# Patient Record
Sex: Female | Born: 1985 | Race: White | Hispanic: No | Marital: Single | Smoking: Never smoker
Health system: Southern US, Community
[De-identification: ages and names within clinical notes are randomized; demographics above are authoritative.]

## PROBLEM LIST (undated history)

## (undated) DIAGNOSIS — F32A Depression, unspecified: Secondary | ICD-10-CM

## (undated) DIAGNOSIS — F419 Anxiety disorder, unspecified: Secondary | ICD-10-CM

## (undated) DIAGNOSIS — M1990 Unspecified osteoarthritis, unspecified site: Secondary | ICD-10-CM

## (undated) DIAGNOSIS — F1910 Other psychoactive substance abuse, uncomplicated: Secondary | ICD-10-CM

## (undated) DIAGNOSIS — I10 Essential (primary) hypertension: Secondary | ICD-10-CM

## (undated) DIAGNOSIS — F329 Major depressive disorder, single episode, unspecified: Secondary | ICD-10-CM

## (undated) HISTORY — PX: TONSILLECTOMY: SUR1361

---

## 2009-12-02 ENCOUNTER — Encounter: Admission: RE | Admit: 2009-12-02 | Discharge: 2009-12-02 | Payer: Self-pay | Admitting: Internal Medicine

## 2010-02-09 ENCOUNTER — Ambulatory Visit
Admission: RE | Admit: 2010-02-09 | Discharge: 2010-02-09 | Payer: Self-pay | Source: Home / Self Care | Admitting: Otolaryngology

## 2010-05-25 LAB — BASIC METABOLIC PANEL
BUN: 6 mg/dL (ref 6–23)
CO2: 27 mEq/L (ref 19–32)
Chloride: 105 mEq/L (ref 96–112)
Creatinine, Ser: 0.73 mg/dL (ref 0.4–1.2)
Potassium: 4.2 mEq/L (ref 3.5–5.1)

## 2010-05-25 LAB — POCT HEMOGLOBIN-HEMACUE: Hemoglobin: 12.6 g/dL (ref 12.0–15.0)

## 2016-11-04 ENCOUNTER — Emergency Department (HOSPITAL_COMMUNITY): Payer: Self-pay

## 2016-11-04 ENCOUNTER — Inpatient Hospital Stay (HOSPITAL_COMMUNITY): Payer: Self-pay

## 2016-11-04 ENCOUNTER — Inpatient Hospital Stay (HOSPITAL_COMMUNITY)
Admission: EM | Admit: 2016-11-04 | Discharge: 2016-11-09 | DRG: 917 | Disposition: A | Discharge status: Other Acute Inpatient Hospital | Payer: Self-pay | Attending: Internal Medicine | Admitting: Internal Medicine

## 2016-11-04 ENCOUNTER — Encounter (HOSPITAL_COMMUNITY): Payer: Self-pay | Admitting: Emergency Medicine

## 2016-11-04 DIAGNOSIS — Z9289 Personal history of other medical treatment: Secondary | ICD-10-CM

## 2016-11-04 DIAGNOSIS — R001 Bradycardia, unspecified: Secondary | ICD-10-CM | POA: Diagnosis present

## 2016-11-04 DIAGNOSIS — G9341 Metabolic encephalopathy: Secondary | ICD-10-CM | POA: Diagnosis present

## 2016-11-04 DIAGNOSIS — R45851 Suicidal ideations: Secondary | ICD-10-CM

## 2016-11-04 DIAGNOSIS — F329 Major depressive disorder, single episode, unspecified: Secondary | ICD-10-CM | POA: Diagnosis present

## 2016-11-04 DIAGNOSIS — F332 Major depressive disorder, recurrent severe without psychotic features: Secondary | ICD-10-CM | POA: Diagnosis present

## 2016-11-04 DIAGNOSIS — F419 Anxiety disorder, unspecified: Secondary | ICD-10-CM | POA: Diagnosis present

## 2016-11-04 DIAGNOSIS — T50902A Poisoning by unspecified drugs, medicaments and biological substances, intentional self-harm, initial encounter: Principal | ICD-10-CM | POA: Diagnosis present

## 2016-11-04 DIAGNOSIS — Z9911 Dependence on respirator [ventilator] status: Secondary | ICD-10-CM

## 2016-11-04 DIAGNOSIS — R9431 Abnormal electrocardiogram [ECG] [EKG]: Secondary | ICD-10-CM | POA: Diagnosis present

## 2016-11-04 DIAGNOSIS — R402432 Glasgow coma scale score 3-8, at arrival to emergency department: Secondary | ICD-10-CM

## 2016-11-04 DIAGNOSIS — Z9114 Patient's other noncompliance with medication regimen: Secondary | ICD-10-CM

## 2016-11-04 DIAGNOSIS — Z915 Personal history of self-harm: Secondary | ICD-10-CM

## 2016-11-04 DIAGNOSIS — J9601 Acute respiratory failure with hypoxia: Secondary | ICD-10-CM | POA: Diagnosis present

## 2016-11-04 DIAGNOSIS — E875 Hyperkalemia: Secondary | ICD-10-CM | POA: Diagnosis present

## 2016-11-04 DIAGNOSIS — G9340 Encephalopathy, unspecified: Secondary | ICD-10-CM

## 2016-11-04 DIAGNOSIS — I959 Hypotension, unspecified: Secondary | ICD-10-CM

## 2016-11-04 DIAGNOSIS — I10 Essential (primary) hypertension: Secondary | ICD-10-CM | POA: Diagnosis present

## 2016-11-04 DIAGNOSIS — T50901A Poisoning by unspecified drugs, medicaments and biological substances, accidental (unintentional), initial encounter: Secondary | ICD-10-CM | POA: Diagnosis present

## 2016-11-04 DIAGNOSIS — T17908A Unspecified foreign body in respiratory tract, part unspecified causing other injury, initial encounter: Secondary | ICD-10-CM | POA: Diagnosis present

## 2016-11-04 DIAGNOSIS — M1990 Unspecified osteoarthritis, unspecified site: Secondary | ICD-10-CM | POA: Diagnosis present

## 2016-11-04 DIAGNOSIS — J690 Pneumonitis due to inhalation of food and vomit: Secondary | ICD-10-CM | POA: Diagnosis present

## 2016-11-04 DIAGNOSIS — J9383 Other pneumothorax: Secondary | ICD-10-CM | POA: Diagnosis present

## 2016-11-04 DIAGNOSIS — I4581 Long QT syndrome: Secondary | ICD-10-CM | POA: Diagnosis present

## 2016-11-04 DIAGNOSIS — E876 Hypokalemia: Secondary | ICD-10-CM | POA: Clinically undetermined

## 2016-11-04 DIAGNOSIS — J939 Pneumothorax, unspecified: Secondary | ICD-10-CM | POA: Clinically undetermined

## 2016-11-04 HISTORY — DX: Unspecified osteoarthritis, unspecified site: M19.90

## 2016-11-04 HISTORY — DX: Anxiety disorder, unspecified: F41.9

## 2016-11-04 HISTORY — DX: Depression, unspecified: F32.A

## 2016-11-04 HISTORY — DX: Essential (primary) hypertension: I10

## 2016-11-04 HISTORY — DX: Major depressive disorder, single episode, unspecified: F32.9

## 2016-11-04 HISTORY — DX: Other psychoactive substance abuse, uncomplicated: F19.10

## 2016-11-04 LAB — I-STAT ARTERIAL BLOOD GAS, ED
Bicarbonate: 26.6 mmol/L (ref 20.0–28.0)
O2 SAT: 100 %
PCO2 ART: 50.7 mmHg — AB (ref 32.0–48.0)
TCO2: 28 mmol/L (ref 22–32)
pH, Arterial: 7.324 — ABNORMAL LOW (ref 7.350–7.450)
pO2, Arterial: 501 mmHg — ABNORMAL HIGH (ref 83.0–108.0)

## 2016-11-04 LAB — BASIC METABOLIC PANEL
Anion gap: 10 (ref 5–15)
BUN: 8 mg/dL (ref 6–20)
CALCIUM: 9.5 mg/dL (ref 8.9–10.3)
CHLORIDE: 108 mmol/L (ref 101–111)
CO2: 25 mmol/L (ref 22–32)
Creatinine, Ser: 0.81 mg/dL (ref 0.44–1.00)
GFR calc Af Amer: >60 mL/min (ref >60)
Glucose, Bld: 109 mg/dL — ABNORMAL HIGH (ref 65–99)
Potassium: 4.2 mmol/L (ref 3.5–5.1)
SODIUM: 143 mmol/L (ref 135–145)

## 2016-11-04 LAB — CBC
HCT: 40.4 % (ref 36.0–46.0)
Hemoglobin: 13.8 g/dL (ref 12.0–15.0)
MCH: 30.3 pg (ref 26.0–34.0)
MCHC: 34.2 g/dL (ref 30.0–36.0)
MCV: 88.8 fL (ref 78.0–100.0)
PLATELETS: 358 10*3/uL (ref 150–400)
RBC: 4.55 MIL/uL (ref 3.87–5.11)
RDW: 13.2 % (ref 11.5–15.5)
WBC: 13.5 10*3/uL — AB (ref 4.0–10.5)

## 2016-11-04 LAB — GLUCOSE, CAPILLARY
Glucose-Capillary: 120 mg/dL — ABNORMAL HIGH (ref 65–99)
Glucose-Capillary: 120 mg/dL — ABNORMAL HIGH (ref 65–99)

## 2016-11-04 LAB — I-STAT CHEM 8, ED
BUN: 12 mg/dL (ref 6–20)
Calcium, Ion: 1.08 mmol/L — ABNORMAL LOW (ref 1.15–1.40)
Chloride: 110 mmol/L (ref 101–111)
Creatinine, Ser: 0.7 mg/dL (ref 0.44–1.00)
Glucose, Bld: 155 mg/dL — ABNORMAL HIGH (ref 65–99)
HEMATOCRIT: 42 % (ref 36.0–46.0)
HEMOGLOBIN: 14.3 g/dL (ref 12.0–15.0)
POTASSIUM: 6.4 mmol/L — AB (ref 3.5–5.1)
Sodium: 140 mmol/L (ref 135–145)
TCO2: 26 mmol/L (ref 22–32)

## 2016-11-04 LAB — SALICYLATE LEVEL

## 2016-11-04 LAB — LACTIC ACID, PLASMA
LACTIC ACID, VENOUS: 2.9 mmol/L — AB (ref 0.5–1.9)
LACTIC ACID, VENOUS: 3.5 mmol/L — AB (ref 0.5–1.9)

## 2016-11-04 LAB — URINALYSIS, ROUTINE W REFLEX MICROSCOPIC
Bilirubin Urine: NEGATIVE
Glucose, UA: NEGATIVE mg/dL
Ketones, ur: 5 mg/dL — AB
Leukocytes, UA: NEGATIVE
Nitrite: NEGATIVE
PH: 5 (ref 5.0–8.0)
Protein, ur: 30 mg/dL — AB
SPECIFIC GRAVITY, URINE: 1.028 (ref 1.005–1.030)

## 2016-11-04 LAB — COMPREHENSIVE METABOLIC PANEL
ALK PHOS: 42 U/L (ref 38–126)
ALT: 28 U/L (ref 14–54)
ANION GAP: 10 (ref 5–15)
AST: 62 U/L — ABNORMAL HIGH (ref 15–41)
Albumin: 3.8 g/dL (ref 3.5–5.0)
BILIRUBIN TOTAL: 1.1 mg/dL (ref 0.3–1.2)
BUN: 9 mg/dL (ref 6–20)
CALCIUM: 8.8 mg/dL — AB (ref 8.9–10.3)
CO2: 22 mmol/L (ref 22–32)
CREATININE: 0.79 mg/dL (ref 0.44–1.00)
Chloride: 109 mmol/L (ref 101–111)
GFR calc non Af Amer: >60 mL/min (ref >60)
GLUCOSE: 123 mg/dL — AB (ref 65–99)
Potassium: 4 mmol/L (ref 3.5–5.1)
Sodium: 141 mmol/L (ref 135–145)
TOTAL PROTEIN: 6.5 g/dL (ref 6.5–8.1)

## 2016-11-04 LAB — TROPONIN I: TROPONIN I: 0.04 ng/mL — AB (ref <0.03)

## 2016-11-04 LAB — RAPID URINE DRUG SCREEN, HOSP PERFORMED
Amphetamines: NOT DETECTED
Barbiturates: NOT DETECTED
Benzodiazepines: NOT DETECTED
Cocaine: NOT DETECTED
OPIATES: NOT DETECTED
Tetrahydrocannabinol: NOT DETECTED

## 2016-11-04 LAB — CK: CK TOTAL: 7143 U/L — AB (ref 38–234)

## 2016-11-04 LAB — ACETAMINOPHEN LEVEL: Acetaminophen (Tylenol), Serum: <10 ug/mL — ABNORMAL LOW (ref 10–30)

## 2016-11-04 LAB — ETHANOL: Alcohol, Ethyl (B): <5 mg/dL (ref <5)

## 2016-11-04 LAB — TRIGLYCERIDES: Triglycerides: 53 mg/dL (ref <150)

## 2016-11-04 LAB — I-STAT BETA HCG BLOOD, ED (MC, WL, AP ONLY)

## 2016-11-04 LAB — MRSA PCR SCREENING: MRSA by PCR: NEGATIVE

## 2016-11-04 LAB — PROCALCITONIN

## 2016-11-04 LAB — LIPASE, BLOOD: Lipase: 22 U/L (ref 11–51)

## 2016-11-04 MED ORDER — SODIUM CHLORIDE 0.9 % IV SOLN
3.0000 g | Freq: Four times a day (QID) | INTRAVENOUS | Status: DC
  Administered 2016-11-04 – 2016-11-07 (×12): 3 g via INTRAVENOUS
  Filled 2016-11-04 (×15): qty 3

## 2016-11-04 MED ORDER — PROPOFOL 1000 MG/100ML IV EMUL
INTRAVENOUS | Status: AC
  Filled 2016-11-04: qty 100

## 2016-11-04 MED ORDER — PROPOFOL 1000 MG/100ML IV EMUL: 5.0000 ug/kg/min | Freq: Once | INTRAVENOUS | Status: DC

## 2016-11-04 MED ORDER — SODIUM CHLORIDE 0.9 % IV SOLN
1.0000 g | Freq: Once | INTRAVENOUS | Status: AC
  Administered 2016-11-04: 1 g via INTRAVENOUS
  Filled 2016-11-04: qty 10

## 2016-11-04 MED ORDER — PROPOFOL 1000 MG/100ML IV EMUL: 0.0000 ug/kg/min | INTRAVENOUS | Status: DC

## 2016-11-04 MED ORDER — PANTOPRAZOLE SODIUM 40 MG IV SOLR
40.0000 mg | Freq: Every day | INTRAVENOUS | Status: DC
  Administered 2016-11-04 – 2016-11-06 (×3): 40 mg via INTRAVENOUS
  Filled 2016-11-04 (×3): qty 40

## 2016-11-04 MED ORDER — FENTANYL CITRATE (PF) 100 MCG/2ML IJ SOLN
INTRAMUSCULAR | Status: AC
  Filled 2016-11-04: qty 2

## 2016-11-04 MED ORDER — FENTANYL CITRATE (PF) 100 MCG/2ML IJ SOLN: 100.0000 ug | INTRAMUSCULAR | Status: DC | PRN

## 2016-11-04 MED ORDER — ETOMIDATE 2 MG/ML IV SOLN
INTRAVENOUS | Status: DC | PRN
  Administered 2016-11-04: 20 mg via INTRAVENOUS

## 2016-11-04 MED ORDER — FENTANYL CITRATE (PF) 100 MCG/2ML IJ SOLN
50.0000 ug | Freq: Once | INTRAMUSCULAR | Status: AC
  Administered 2016-11-04: 50 ug via INTRAVENOUS

## 2016-11-04 MED ORDER — SODIUM CHLORIDE 0.9 % IV SOLN: 250.0000 mL | INTRAVENOUS | Status: DC | PRN

## 2016-11-04 MED ORDER — FENTANYL 2500MCG IN NS 250ML (10MCG/ML) PREMIX INFUSION
25.0000 ug/h | INTRAVENOUS | Status: DC
  Administered 2016-11-04: 50 ug/h via INTRAVENOUS
  Administered 2016-11-05: 100 ug/h via INTRAVENOUS
  Filled 2016-11-04 (×2): qty 250

## 2016-11-04 MED ORDER — SUCCINYLCHOLINE CHLORIDE 20 MG/ML IJ SOLN
INTRAMUSCULAR | Status: DC | PRN
  Administered 2016-11-04: 100 mg via INTRAVENOUS

## 2016-11-04 MED ORDER — CHLORHEXIDINE GLUCONATE 0.12% ORAL RINSE (MEDLINE KIT)
15.0000 mL | Freq: Two times a day (BID) | OROMUCOSAL | Status: DC
  Administered 2016-11-04 – 2016-11-06 (×5): 15 mL via OROMUCOSAL

## 2016-11-04 MED ORDER — ORAL CARE MOUTH RINSE
15.0000 mL | Freq: Four times a day (QID) | OROMUCOSAL | Status: DC
  Administered 2016-11-04 – 2016-11-06 (×9): 15 mL via OROMUCOSAL

## 2016-11-04 MED ORDER — HEPARIN SODIUM (PORCINE) 5000 UNIT/ML IJ SOLN
5000.0000 [IU] | Freq: Three times a day (TID) | INTRAMUSCULAR | Status: DC
  Administered 2016-11-04 – 2016-11-08 (×14): 5000 [IU] via SUBCUTANEOUS
  Filled 2016-11-04 (×15): qty 1

## 2016-11-04 MED ORDER — SODIUM CHLORIDE 0.9 % IV SOLN
INTRAVENOUS | Status: DC
  Administered 2016-11-04 – 2016-11-06 (×7): via INTRAVENOUS

## 2016-11-04 MED ORDER — FENTANYL BOLUS VIA INFUSION
25.0000 ug | INTRAVENOUS | Status: DC | PRN
  Administered 2016-11-04 – 2016-11-06 (×4): 25 ug via INTRAVENOUS
  Filled 2016-11-04: qty 25
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
## 2016-11-04 NOTE — ED Provider Notes (Signed)
Emergency Department Provider Note   I have reviewed the triage vital signs and the nursing notes.   HISTORY  Chief Complaint Drug Overdose   HPI Jennifer [name] is a 31 y.o. female with PMH of HTN and substance abuse presents to the emergency department for evaluation by EMS after being found unresponsive at home. EMS report that the patient has a history of substance abuse. The patient apparently has been under a lot of stress recently with the death of her father. Family found her unresponsive in the bathroom and noticed she had written a suicide note. Mom states that she had recently increased her dose of Prozac. Family is not sure exactly what the patient took. She also had access to Tylenol and Motrin. EMS gave a total of 6 mg of Narcan on scene with minimal improvement in symptoms so transported her to the emergency department actively performing BVM ventilation. The patient is from [place] but family is not aware of other home medications.   Level 5 caveat: patient with GCS 3 and unable to provide history.   Past Medical History:  Diagnosis Date  . Anxiety   . Arthritis   . Depressed   . Hypertension   . Substance abuse     Patient Active Problem List   Diagnosis Date Noted  . Overdose 11/04/2016    History reviewed. No pertinent surgical history.    Allergies Patient has no known allergies.  No family history on file.  Social History Social History  Substance Use Topics  . Smoking status: Not on file  . Smokeless tobacco: Not on file  . Alcohol use Not on file    Review of Systems  Level 5 caveat: Overdose, GCS 3, peri-intubation.   ____________________________________________   PHYSICAL EXAM:  VITAL SIGNS: ED Triage Vitals [11/04/16 1049]  Enc Vitals Group     BP (!) 137/93     Pulse Rate (!) 58     Resp      Temp (!) 97 F (36.1 C)     Temp Source Axillary     SpO2 100 %   Constitutional: Obtunded.  Eyes: Pupils 1 mm and sluggish.    Head: Atraumatic. Nose: No congestion/rhinnorhea. Mouth/Throat: Mucous membranes are moist.  Neck: No stridor.   Cardiovascular: Bradycardia. Good peripheral circulation. Grossly normal heart sounds.   Respiratory: BVM on arrival with equal breath sounds.  Gastrointestinal: Soft. No distention.  Musculoskeletal: No lower extremity tenderness nor edema. No gross deformities of extremities. Neurologic: GCS 3.  Skin:  Skin is warm, dry and intact. No rash noted. Gray discoloration over chest and upper arms that rubs off with alcohol wipe. No extremity cyanosis.   ____________________________________________   LABS (all labs ordered are listed, but only abnormal results are displayed)  Labs Reviewed  CBC - Abnormal; Notable for the following:       Result Value   WBC 13.5 (*)    All other components within normal limits  COMPREHENSIVE METABOLIC PANEL - Abnormal; Notable for the following:    Glucose, Bld 123 (*)    Calcium 8.8 (*)    AST 62 (*)    All other components within normal limits  ACETAMINOPHEN LEVEL - Abnormal; Notable for the following:    Acetaminophen (Tylenol), Serum <10 (*)    All other components within normal limits  I-STAT CHEM 8, ED - Abnormal; Notable for the following:    Potassium 6.4 (*)    Glucose, Bld 155 (*)  Calcium, Ion 1.08 (*)    All other components within normal limits  CULTURE, BLOOD (ROUTINE X 2)  CULTURE, BLOOD (ROUTINE X 2)  CULTURE, RESPIRATORY (NON-EXPECTORATED)  RAPID URINE DRUG SCREEN, HOSP PERFORMED  LIPASE, BLOOD  ETHANOL  SALICYLATE LEVEL  BASIC METABOLIC PANEL  PROCALCITONIN  URINALYSIS, ROUTINE W REFLEX MICROSCOPIC  TRIGLYCERIDES  LACTIC ACID, PLASMA  LACTIC ACID, PLASMA  TROPONIN I  TROPONIN I  TROPONIN I  BLOOD GAS, ARTERIAL  I-STAT BETA HCG BLOOD, ED ([facility], [facility], [facility] ONLY)   ____________________________________________  EKG   EKG Interpretation  Date/Time:  Friday November 04 2016 11:31:58 EDT Ventricular Rate:   51 PR Interval:    QRS Duration: 68 QT Interval:  502 QTC Calculation: 463 R Axis:   69 Text Interpretation:  Sinus rhythm Borderline short PR interval No STEMI.  Confirmed by Alona Bene [phone]) on 11/04/2016 11:38:11 AM       ____________________________________________  RADIOLOGY  Dg Chest 1 View  Result Date: 11/04/2016 CLINICAL DATA:  Patient found unconscious today.  Substance abuse. EXAM: CHEST 1 VIEW COMPARISON:  None. FINDINGS: Endotracheal tube is in place with the tip just below the clavicular heads in good position. OG tube courses into the stomach with its tip below the inferior margin of film. Small focus of subsegmental atelectasis in the right upper lung zone is identified. Small right apical pneumothorax is identified. No left pneumothorax. No pleural fluid. No acute bony abnormality. IMPRESSION: ETT and NG tube projecting good position. Small right apical pneumothorax. Subsegmental atelectasis right upper lobe lung zone. Critical Value/emergent results were called by telephone at the time of interpretation on 11/04/2016 at 11:34 am to Dr. Alona Bene , who verbally acknowledged these results. Electronically Signed   By: Drusilla Kanner M.D.   On: 11/04/2016 11:35   Ct Head Wo Contrast  Result Date: 11/04/2016 CLINICAL DATA:  Found on floor, unresponsive. EXAM: CT HEAD WITHOUT CONTRAST TECHNIQUE: Contiguous axial images were obtained from the base of the skull through the vertex without intravenous contrast. COMPARISON:  None. FINDINGS: Brain: No acute intracranial abnormality. Specifically, no hemorrhage, hydrocephalus, mass lesion, acute infarction, or significant intracranial injury. Vascular: No hyperdense vessel or unexpected calcification. Skull: No acute calvarial abnormality. Sinuses/Orbits: Mucosal thickening in the paranasal sinuses. Mastoid air cells are clear. Orbital soft tissues unremarkable. Other: None IMPRESSION: No acute intracranial abnormality. Chronic  sinusitis. Electronically Signed   By: Charlett Nose M.D.   On: 11/04/2016 12:04    ____________________________________________   PROCEDURES  Procedure(s) performed:   Date/Time: 11/04/2016 11:33 AM Performed by: Maia Plan Pre-anesthesia Checklist: Emergency Drugs available, Patient identified, Patient being monitored and Suction available Oxygen Delivery Method: Ambu bag Preoxygenation: Pre-oxygenation with 100% oxygen Laryngoscope Size: Glidescope and 4 Grade View: Grade I Tube size: 7.5 mm Number of attempts: 1 Airway Equipment and Method: Video-laryngoscopy Placement Confirmation: ETT inserted through vocal cords under direct vision,  Positive ETCO2,  CO2 detector and Breath sounds checked- equal and bilateral Secured at: 23 cm Tube secured with: ETT holder Dental Injury: Teeth and Oropharynx as per pre-operative assessment       CRITICAL CARE Performed by: Maia Plan Total critical care time: 50 minutes Critical care time was exclusive of separately billable procedures and treating other patients. Critical care was necessary to treat or prevent imminent or life-threatening deterioration. Critical care was time spent personally by me on the following activities: development of treatment plan with patient and/or surrogate as well as nursing, discussions with consultants,  evaluation of patient's response to treatment, examination of patient, obtaining history from patient or surrogate, ordering and performing treatments and interventions, ordering and review of laboratory studies, ordering and review of radiographic studies, pulse oximetry and re-evaluation of patient's condition.  Alona Bene, MD Emergency Medicine    Emergency Ultrasound Study:   Angiocath insertion Performed by: Maia Plan  Consent: Emergent  Indication: difficult IV access Preparation: Patient was prepped and draped in the usual sterile fashion. Vein Location: Left AC was visualized  during assessment for potential access sites and was found to be patent/ easily compressed with linear ultrasound.  The needle was visualized with real-time ultrasound and guided into the vein. Gauge: 20  Image saved and stored.  Normal blood return.  Patient tolerance: Patient tolerated the procedure well with no immediate complications.   ____________________________________________   INITIAL IMPRESSION / ASSESSMENT AND PLAN / ED COURSE  Pertinent labs & imaging results that were available during my care of the patient were reviewed by me and considered in my medical decision making (see chart for details).  Patient resents to the emergency department for evaluation after an apparent overdose. On arrival the patient had GCS of 3. No evidence of head trauma. Patient apparently wrote a suicide note which was found shortly after discovering her. Patient was intubated on arrival to the emergency department. 6 mg of Narcan given prior to arrival with little to no results. No complications during intubation. Family updated with chaplain in consultation room.   11:36 AM Called by radiology to report a small (<5%) apical PNX on CXR.   12:38 PM Discussed patient's case with Critical Care to request admission. Patient and family (if present) updated with plan. Care transferred to Critical Care service.  I reviewed all nursing notes, vitals, pertinent old records, EKGs, labs, imaging (as available). ____________________________________________  FINAL CLINICAL IMPRESSION(S) / ED DIAGNOSES  Final diagnoses:  Glasgow coma scale total score 3-8, at arrival to emergency department [facility])  Overdose, intentional self-harm, initial encounter [facility])     MEDICATIONS GIVEN DURING THIS VISIT:  Medications  etomidate (AMIDATE) injection (20 mg Intravenous Given 11/04/16 1043)  succinylcholine (ANECTINE) injection (100 mg Intravenous Given 11/04/16 1044)  0.9 %  sodium chloride infusion (not  administered)  heparin injection 5,000 Units (5,000 Units Subcutaneous Given 11/04/16 1333)  pantoprazole (PROTONIX) injection 40 mg (not administered)  Ampicillin-Sulbactam (UNASYN) 3 g in sodium chloride 0.9 % 100 mL IVPB (3 g Intravenous New Bag/Given 11/04/16 1333)  fentaNYL in NS (82mcg/ml) infusion-PREMIX (not administered)  fentaNYL (SUBLIMAZE) bolus via infusion 25 mcg (not administered)  fentaNYL (SUBLIMAZE) 100 MCG/2ML injection (not administered)  0.9 %  sodium chloride infusion ( Intravenous New Bag/Given 11/04/16 1338)  calcium gluconate 1 g in sodium chloride 0.9 % 100 mL IVPB (0 g Intravenous Stopped 11/04/16 1318)  fentaNYL (SUBLIMAZE) injection 50 mcg (50 mcg Intravenous Given 11/04/16 1333)     NEW OUTPATIENT MEDICATIONS STARTED DURING THIS VISIT:  None   Note:  This document was prepared using Dragon voice recognition software and may include unintentional dictation errors.  Alona Bene, MD Emergency Medicine    Aviv Lengacher, Arlyss Repress, MD 11/04/16 [phone]

## 2016-11-04 NOTE — Progress Notes (Signed)
   11/04/16 1050  Clinical Encounter Type  Visited With Family  Visit Type ED  Spiritual Encounters  Spiritual Needs Emotional  Stress Factors  Patient Stress Factors Not reviewed  Family Stress Factors Family relationships  Paged to escort family to consult B. Pt stable. Provided ministry of presence. Renato Gails and mother escorted to bedside.

## 2016-11-04 NOTE — ED Notes (Signed)
Pt's mother informed staff that pt may have taken (6?) prozac. Father recently passed away. Pt has had multiple SI attempts

## 2016-11-04 NOTE — Progress Notes (Signed)
CRITICAL VALUE ALERT  Critical Value:  Lactic acid 3.5  Date & Time Notied:  11/04/16 1930  Provider Notified: Jamison Neighbor  Orders Received/Actions taken: none given

## 2016-11-04 NOTE — Progress Notes (Signed)
Patient transported to CT and returned to trauma C. No complications. Vital signs stable at this time. Patient tolerated well. RN, family, and pastor at bedside. RT will continue to monitor.

## 2016-11-04 NOTE — Progress Notes (Signed)
eLink Physician-Brief Progress Note Patient Name: Jennifer [name] DOB: Jun 17, 1985 MRN: [id number]   Date of Service  11/04/2016  HPI/Events of Note  Slightly worsening lactic acidosis at 3.5. CK elevated at 7143. Currently on normal saline at 75 mL per hour. Unknown down time.   eICU Interventions  1. Repeat CK with a.m. labs 2. Increasing IV fluids to 150 mL per hour 3. Continuing to trend lactic acid      Intervention Category Major Interventions: Acid-Base disturbance - evaluation and management  Lawanda Cousins 11/04/2016, 8:16 PM

## 2016-11-04 NOTE — Progress Notes (Signed)
Initial Nutrition Assessment  DOCUMENTATION CODES:   Not applicable  INTERVENTION:    If TF started, rec Vital AF 1.2 at goal rate of 55 ml/h (1320 ml per day) and Prostat 30 ml BID  TF regimen to provide 1784 kcals, 129 gm protein, 1070 ml of free water  NUTRITION DIAGNOSIS:   Inadequate oral intake related to inability to eat as evidenced by NPO status  GOAL:   Patient will meet greater than or equal to 90% of their needs  MONITOR:   Vent status, Labs, Weight trends, Skin, I & O's  REASON FOR ASSESSMENT:   Ventilator  ASSESSMENT:   31 year old female presents to ED after being found down with suicide note with presumed overdose. Intubated and sedated.   Patient is currently intubated on ventilator support  MV: 8.6 L/min Temp (24hrs), Avg:97 F (36.1 C), Min:96.1 F (35.6 C), Max:98.1 F (36.7 C)  OGT in place  RD unable to obtain nutrition hx. No family at bedside.  Pt with respiratory insuffiencey and R apical pneumothorax.  No muscle or subcutaneous fat depletion noticed. Labs and medications reviewed.  CBG's 155-123.  Diet Order:  Diet NPO time specified  Skin:  Reviewed, no issues  Last BM:  N/A  Height:   Ht Readings from Last 1 Encounters:  11/04/16 5\' 10"  (1.778 m)   Weight:   Wt Readings from Last 1 Encounters:  11/04/16 178 lb 12.7 oz (81.1 kg)   Ideal Body Weight:  68.1 kg  BMI:  Body mass index is 25.65 kg/m.  Estimated Nutritional Needs:   Kcal:  1780  Protein:  120-135 gm  Fluid:  per MD  EDUCATION NEEDS:   No education needs identified at this time  Maureen Chatters, RD, LDN Pager #: [phone] After-Hours Pager #: [phone]

## 2016-11-04 NOTE — ED Triage Notes (Signed)
Pt found unresponsive by family. Laying in bathroom. EMS arrived pt GCS 3. EMS gave total of 6 mg narcan with only pupilary response. Known hx of substance abuse. Family found SI note as EMS was leaving. HR 54, BP 92/70

## 2016-11-04 NOTE — Sedation Documentation (Signed)
Intubation at 1044

## 2016-11-04 NOTE — Progress Notes (Signed)
CRITICAL VALUE ALERT  Critical Value:  LA 2.9  Date & Time Notied:  11/04/16 at 1520  Provider Notified: Dr Jamison Neighbor  Orders Received/Actions taken: No new orders given

## 2016-11-04 NOTE — Progress Notes (Signed)
CRITICAL VALUE ALERT  Critical Value:  Trop 0.04  Date & Time Notied:  11/04/16 at 1520  Provider Notified: Dr Jamison Neighbor   Orders Received/Actions taken: No new orders given

## 2016-11-04 NOTE — ED Notes (Addendum)
Pt's sister at bedside states pt abuses prescription medications, no street drug use. Pts boyfriend called family and reports that pt was on phone with him around 2 am telling him she was depressed and had taken some muscle relaxers . Pt vomited while on phone with boyfriend- boyfriend unable to get in touch with family. Family found pt in bathroom this morning. Pt has hx of previous SI attempt with overdose on prescription medications.

## 2016-11-04 NOTE — ED Notes (Signed)
Verbal order to Hold propofol at this time due to HR per MD

## 2016-11-04 NOTE — Progress Notes (Signed)
eLink Physician-Brief Progress Note Patient Name: GISSELLA COVERDALE DOB: 1985/09/30 MRN: [id number]   Date of Service  11/04/2016  HPI/Events of Note  31 year old female admitted after suicide attempt and found down. Seen by intensivist. Labs reviewed. Normal urine drug screen, salicylate, and Tylenol. AST/ALT ratio nearly 2-1.   eICU Interventions  1. Checking CK 2. Further plan of care as per intensivist      Intervention Category Evaluation Type: New Patient Evaluation  Lawanda Cousins 11/04/2016, 3:29 PM

## 2016-11-05 ENCOUNTER — Inpatient Hospital Stay (HOSPITAL_COMMUNITY): Payer: Self-pay

## 2016-11-05 DIAGNOSIS — I10 Essential (primary) hypertension: Secondary | ICD-10-CM

## 2016-11-05 LAB — BLOOD GAS, ARTERIAL
ACID-BASE EXCESS: 0.4 mmol/L (ref 0.0–2.0)
Bicarbonate: 24.7 mmol/L (ref 20.0–28.0)
Drawn by: 41977
FIO2: 40
O2 SAT: 99 %
PEEP/CPAP: 5 cmH2O
PH ART: 7.392 (ref 7.350–7.450)
Patient temperature: 98.6
RATE: 16 resp/min
VT: 500 mL
pCO2 arterial: 41.5 mmHg (ref 32.0–48.0)
pO2, Arterial: 169 mmHg — ABNORMAL HIGH (ref 83.0–108.0)

## 2016-11-05 LAB — CBC
HCT: 41.6 % (ref 36.0–46.0)
Hemoglobin: 14 g/dL (ref 12.0–15.0)
MCH: 30 pg (ref 26.0–34.0)
MCHC: 33.7 g/dL (ref 30.0–36.0)
MCV: 89.1 fL (ref 78.0–100.0)
PLATELETS: 308 10*3/uL (ref 150–400)
RBC: 4.67 MIL/uL (ref 3.87–5.11)
RDW: 13.2 % (ref 11.5–15.5)
WBC: 19.9 10*3/uL — ABNORMAL HIGH (ref 4.0–10.5)

## 2016-11-05 LAB — BASIC METABOLIC PANEL
Anion gap: 10 (ref 5–15)
BUN: 9 mg/dL (ref 6–20)
CALCIUM: 8.3 mg/dL — AB (ref 8.9–10.3)
CO2: 22 mmol/L (ref 22–32)
CREATININE: 0.8 mg/dL (ref 0.44–1.00)
Chloride: 111 mmol/L (ref 101–111)
GFR calc non Af Amer: >60 mL/min (ref >60)
Glucose, Bld: 125 mg/dL — ABNORMAL HIGH (ref 65–99)
Potassium: 3.9 mmol/L (ref 3.5–5.1)
SODIUM: 143 mmol/L (ref 135–145)

## 2016-11-05 LAB — GLUCOSE, CAPILLARY
GLUCOSE-CAPILLARY: 129 mg/dL — AB (ref 65–99)
GLUCOSE-CAPILLARY: 135 mg/dL — AB (ref 65–99)
Glucose-Capillary: 116 mg/dL — ABNORMAL HIGH (ref 65–99)
Glucose-Capillary: 134 mg/dL — ABNORMAL HIGH (ref 65–99)
Glucose-Capillary: 108 mg/dL — ABNORMAL HIGH (ref 65–99)
Glucose-Capillary: 102 mg/dL — ABNORMAL HIGH (ref 65–99)

## 2016-11-05 LAB — PROCALCITONIN: Procalcitonin: 0.21 ng/mL

## 2016-11-05 LAB — PHOSPHORUS: Phosphorus: 4.1 mg/dL (ref 2.5–4.6)

## 2016-11-05 LAB — CK: CK TOTAL: 4008 U/L — AB (ref 38–234)

## 2016-11-05 LAB — MAGNESIUM: MAGNESIUM: 1.8 mg/dL (ref 1.7–2.4)

## 2016-11-05 MED ORDER — INSULIN ASPART 100 UNIT/ML ~~LOC~~ SOLN
2.0000 [IU] | SUBCUTANEOUS | Status: DC
  Administered 2016-11-05: 2 [IU] via SUBCUTANEOUS

## 2016-11-05 NOTE — Progress Notes (Signed)
PULMONARY / CRITICAL CARE MEDICINE   Name: Jennifer [name] MRN: [id number] DOB: 1985/11/16    ADMISSION DATE:  11/04/2016 CONSULTATION DATE:  11/04/2016  REFERRING MD:  Dr. Jacqulyn Bath   CHIEF COMPLAINT: Overdose   HISTORY OF PRESENT ILLNESS:   31 year old female with PMH of anxiety, depression, HTN, and Substance abuse  Presents to ED on 8/24 after being found unresponsive at home with a suicide note. Per Family patient has been depression lately with the recent death of her father. Unsure of medication patient ingested, however she is prescribed prozac and had a recent increase in dosage. Was given 6 mg Narcan with minimal improvement. Upon arrival to ED was intubated. UDS negative. Head CT negative. PCCM asked to admit.   PAST MEDICAL HISTORY :  She  has a past medical history of Anxiety; Arthritis; Depressed; Hypertension; and Substance abuse.  PAST SURGICAL HISTORY: She  has no past surgical history on file.  No Known Allergies  No current facility-administered medications on file prior to encounter.    No current outpatient prescriptions on file prior to encounter.    FAMILY HISTORY:  Her has no family status information on file.    SOCIAL HISTORY: She    REVIEW OF SYSTEMS:   Unable to review as patient is intubated and sedated   SUBJECTIVE:    VITAL SIGNS: BP (!) 118/91   Pulse (!) 120   Temp 99.5 F (37.5 C)   Resp 10   Ht 5\' 10"  (1.778 m)   Wt 82.1 kg (181 lb)   LMP  (LMP Unknown) Comment: Pt unresponsive at time of x-rays 11/04/16  SpO2 100%   BMI 25.97 kg/m   HEMODYNAMICS:    VENTILATOR SETTINGS: Vent Mode: PSV;CPAP FiO2 (%):  [40 %] 40 % Set Rate:  [16 bmp] 16 bmp Vt Set:  [500 mL] 500 mL PEEP:  [5 cmH20] 5 cmH20 Pressure Support:  [5 cmH20] 5 cmH20 Plateau Pressure:  [14 cmH20-15 cmH20] 14 cmH20  INTAKE / OUTPUT: I/O last 3 completed shifts: In: 2490.2 [I.V.:2190.2; IV Piggyback:300] Out: 1230 [Urine:930; Emesis/NG output:300]  PHYSICAL  EXAMINATION: General:  Adult female, no distress  Neuro:  +Gag, does not follow commands, withdrawals from pain  HEENT:  ETT in place  Cardiovascular:  Huston Foley, no MRG Lungs:  Diminished breath sounds, non-labored  Abdomen:  Non-distended, active bowel sounds  Musculoskeletal:  -edema  Skin:  Warm, dry, intact   LABS:  BMET  Recent Labs Lab 11/04/16 1232 11/04/16 1520 11/05/16 0326  NA 141 143 143  K 4.0 4.2 3.9  CL 109 108 111  CO2 22 25 22   BUN 9 8 9   CREATININE 0.79 0.81 0.80  GLUCOSE 123* 109* 125*    Electrolytes  Recent Labs Lab 11/04/16 1232 11/04/16 1520 11/05/16 0326  CALCIUM 8.8* 9.5 8.3*  MG  --   --  1.8  PHOS  --   --  4.1    CBC  Recent Labs Lab 11/04/16 1101 11/04/16 1120 11/05/16 0326  WBC 13.5*  --  19.9*  HGB 13.8 14.3 14.0  HCT 40.4 42.0 41.6  PLT 358  --  308    Coag's No results for input(s): APTT, INR in the last 168 hours.  Sepsis Markers  Recent Labs Lab 11/04/16 1520 11/04/16 1822 11/05/16 0326  LATICACIDVEN 2.9* 3.5*  --   PROCALCITON <0.10  --  0.21    ABG  Recent Labs Lab 11/04/16 1422 11/05/16 0345  PHART 7.324* 7.392  PCO2ART 50.7* 41.5  PO2ART 501.0* 169*    Liver Enzymes  Recent Labs Lab 11/04/16 1232  AST 62*  ALT 28  ALKPHOS 42  BILITOT 1.1  ALBUMIN 3.8    Cardiac Enzymes  Recent Labs Lab 11/04/16 1520  TROPONINI 0.04*    Glucose  Recent Labs Lab 11/04/16 1547 11/04/16 2013 11/04/16 2351 11/05/16 0330  GLUCAP 129* 120* 120* 116*    Imaging Ct Head Wo Contrast  Result Date: 11/04/2016 CLINICAL DATA:  Found on floor, unresponsive. EXAM: CT HEAD WITHOUT CONTRAST TECHNIQUE: Contiguous axial images were obtained from the base of the skull through the vertex without intravenous contrast. COMPARISON:  None. FINDINGS: Brain: No acute intracranial abnormality. Specifically, no hemorrhage, hydrocephalus, mass lesion, acute infarction, or significant intracranial injury. Vascular: No  hyperdense vessel or unexpected calcification. Skull: No acute calvarial abnormality. Sinuses/Orbits: Mucosal thickening in the paranasal sinuses. Mastoid air cells are clear. Orbital soft tissues unremarkable. Other: None IMPRESSION: No acute intracranial abnormality. Chronic sinusitis. Electronically Signed   By: Charlett Nose M.D.   On: 11/04/2016 12:04   Dg Chest Port 1 View  Result Date: 11/04/2016 CLINICAL DATA:  Pneumothorax EXAM: PORTABLE CHEST 1 VIEW COMPARISON:  11/04/2016 at 1443 hours FINDINGS: Lungs are clear. No pleural effusion. Tiny right apical pneumothorax, unchanged. Endotracheal tube terminates 4.5 cm above the carina. Enteric tube courses into the gastric cardia. The heart is normal in size. IMPRESSION: Tiny apical pneumothorax, unchanged. Endotracheal tube terminates 4.5 cm above the carina. Electronically Signed   By: Charline Bills M.D.   On: 11/04/2016 21:46   Dg Chest Port 1 View  Result Date: 11/04/2016 CLINICAL DATA:  Follow up pneumothorax.  Found unresponsive today. EXAM: PORTABLE CHEST 1 VIEW COMPARISON:  Earlier same date. FINDINGS: 1443 hours. The endotracheal and nasogastric tubes appear unchanged. Small right apical pneumothorax is stable to minimally larger. There is no tension component. There is interval improved aeration of the right upper lobe. The lungs are clear. The heart size and mediastinal contours are stable. No acute osseous findings are seen. IMPRESSION: Small right apical pneumothorax is stable to slightly larger from the earlier examination, but there is no mediastinal shift or tension component. No other significant findings. Electronically Signed   By: Carey Bullocks M.D.   On: 11/04/2016 15:01     STUDIES:  CXR 8/24 > Small right apical pneumothorax, subsegmental atelectasis right upper lobe  CT Head 8/24 > No acute   CULTURES: Blood 8/24 >> U/A 8/24 >>  Sputum 8/24 >>  ANTIBIOTICS: Unasyn 8/24 >>  SIGNIFICANT EVENTS: 8/24 > Presents  to ED after being found down with presumed OD  LINES/TUBES: ETT 8/24 >>  DISCUSSION: 31 year old female presents to ED after being found down with suicide note with presumed overdose. Intubated and sedated.   ASSESSMENT / PLAN:  PULMONARY A: Respiratory Insuffiencey in setting of presumed drug overdose  Right Apical Pneumothorax  Possible Aspiration Event  P:   Begin PS trials but no extubation until mental status improves CXR in AM to evaluate PTX Pulmonary Hygiene   CARDIOVASCULAR A:  Bradycardia    QTC Prolongation  HTN  P:  Cardiac monitoring  Hydralazine to Maintain Systolic <180  Troponin negative  RENAL A:   Hyperkalemia > Resolved  P:   Trend BMP Replace electrolytes as indicated  BMET in AM CK 7143 dropping to 4008, will recheck in AM, no diureses  GASTROINTESTINAL A:   No issues  P:   NPO PPI LFT  noted TF per nutrition  HEMATOLOGIC A:   No issues  P:  Trend CBC  INFECTIOUS A:   Possible Aspiration Event  P:   Trend WBC and Fever Curve Follow Culture Data Unasyn    ENDOCRINE A:   No issues    P:   Trend Glucose   NEUROLOGIC A:   Acute Encephalopathy secondary to Presumed Drug Overdose > Reported Prozac and Muscle relaxer  SI  H/O Depression  -CT Head Negative  P:   RASS goal: -1/-2 D/C propofol PRN Fentanyl  Acetaminophen, salicylate, ETOH all negative Will need Psych consult when extubated   FAMILY  - Updates: Mother updated bedside.  - Inter-disciplinary family meet or Palliative Care meeting due by:  11/11/2016  The patient is critically ill with multiple organ systems failure and requires high complexity decision making for assessment and support, frequent evaluation and titration of therapies, application of advanced monitoring technologies and extensive interpretation of multiple databases.   Critical Care Time devoted to patient care services described in this note is  35  Minutes. This time reflects time of care  of this signee Dr Koren Bound. This critical care time does not reflect procedure time, or teaching time or supervisory time of PA/NP/Med student/Med Resident etc but could involve care discussion time.  Alyson Reedy, M.D. [facility] Pulmonary/Critical Care Medicine. Pager: [phone]. After hours pager: [phone].

## 2016-11-06 ENCOUNTER — Inpatient Hospital Stay (HOSPITAL_COMMUNITY): Payer: Self-pay

## 2016-11-06 LAB — GLUCOSE, CAPILLARY
GLUCOSE-CAPILLARY: 90 mg/dL (ref 65–99)
GLUCOSE-CAPILLARY: 114 mg/dL — AB (ref 65–99)
GLUCOSE-CAPILLARY: 97 mg/dL (ref 65–99)
GLUCOSE-CAPILLARY: 93 mg/dL (ref 65–99)
GLUCOSE-CAPILLARY: 92 mg/dL (ref 65–99)
Glucose-Capillary: 106 mg/dL — ABNORMAL HIGH (ref 65–99)
Glucose-Capillary: 86 mg/dL (ref 65–99)

## 2016-11-06 LAB — CBC
HEMATOCRIT: 35.4 % — AB (ref 36.0–46.0)
HEMOGLOBIN: 11.6 g/dL — AB (ref 12.0–15.0)
MCH: 30.4 pg (ref 26.0–34.0)
MCHC: 32.8 g/dL (ref 30.0–36.0)
MCV: 92.9 fL (ref 78.0–100.0)
PLATELETS: 243 10*3/uL (ref 150–400)
RBC: 3.81 MIL/uL — ABNORMAL LOW (ref 3.87–5.11)
RDW: 13.9 % (ref 11.5–15.5)
WBC: 13.6 10*3/uL — ABNORMAL HIGH (ref 4.0–10.5)

## 2016-11-06 LAB — MAGNESIUM: Magnesium: 2 mg/dL (ref 1.7–2.4)

## 2016-11-06 LAB — BASIC METABOLIC PANEL
Anion gap: 6 (ref 5–15)
BUN: 10 mg/dL (ref 6–20)
CO2: 27 mmol/L (ref 22–32)
CREATININE: 0.71 mg/dL (ref 0.44–1.00)
Calcium: 8 mg/dL — ABNORMAL LOW (ref 8.9–10.3)
Chloride: 114 mmol/L — ABNORMAL HIGH (ref 101–111)
GFR calc Af Amer: >60 mL/min (ref >60)
GLUCOSE: 98 mg/dL (ref 65–99)
POTASSIUM: 3.3 mmol/L — AB (ref 3.5–5.1)
Sodium: 147 mmol/L — ABNORMAL HIGH (ref 135–145)

## 2016-11-06 LAB — PROCALCITONIN: Procalcitonin: 0.12 ng/mL

## 2016-11-06 LAB — CK: Total CK: 2671 U/L — ABNORMAL HIGH (ref 38–234)

## 2016-11-06 LAB — PHOSPHORUS: Phosphorus: 1.8 mg/dL — ABNORMAL LOW (ref 2.5–4.6)

## 2016-11-06 MED ORDER — POTASSIUM CHLORIDE 20 MEQ/15ML (10%) PO SOLN
40.0000 meq | Freq: Once | ORAL | Status: AC
  Administered 2016-11-06: 40 meq
  Filled 2016-11-06: qty 30

## 2016-11-06 MED ORDER — PNEUMOCOCCAL VAC POLYVALENT 25 MCG/0.5ML IJ INJ
0.5000 mL | INJECTION | INTRAMUSCULAR | Status: DC
  Filled 2016-11-06: qty 0.5

## 2016-11-06 MED ORDER — WHITE PETROLATUM GEL
Status: AC
  Administered 2016-11-06: 14:00:00
  Filled 2016-11-06: qty 1

## 2016-11-06 MED ORDER — METOPROLOL TARTRATE 5 MG/5ML IV SOLN: 2.5000 mg | INTRAVENOUS | Status: DC | PRN

## 2016-11-06 MED ORDER — POTASSIUM PHOSPHATES 15 MMOLE/5ML IV SOLN
30.0000 mmol | Freq: Once | INTRAVENOUS | Status: AC
  Administered 2016-11-06: 30 mmol via INTRAVENOUS
  Filled 2016-11-06: qty 10

## 2016-11-06 MED ORDER — RESOURCE THICKENUP CLEAR PO POWD
ORAL | Status: DC | PRN
  Filled 2016-11-06: qty 125

## 2016-11-06 MED ORDER — METOPROLOL TARTRATE 5 MG/5ML IV SOLN
INTRAVENOUS | Status: AC
  Administered 2016-11-06: 5 mg
  Filled 2016-11-06: qty 5

## 2016-11-06 NOTE — Progress Notes (Signed)
PULMONARY / CRITICAL CARE MEDICINE   Name: Jennifer [name] MRN: [id number] DOB: 1985-06-30    ADMISSION DATE:  11/04/2016 CONSULTATION DATE:  11/04/2016  REFERRING MD:  Dr. Jacqulyn Bath   CHIEF COMPLAINT: Overdose   HISTORY OF PRESENT ILLNESS:   31 year old female with PMH of anxiety, depression, HTN, and Substance abuse  Presents to ED on 8/24 after being found unresponsive at home with a suicide note. Per Family patient has been depression lately with the recent death of her father. Unsure of medication patient ingested, however she is prescribed prozac and had a recent increase in dosage. Was given 6 mg Narcan with minimal improvement. Upon arrival to ED was intubated. UDS negative. Head CT negative. PCCM asked to admit.   SUBJECTIVE:  More alert this AM but continues to have periods of apnea until awakened  VITAL SIGNS: BP 127/76   Pulse (!) 112   Temp 99.5 F (37.5 C)   Resp 16   Ht 5\' 10"  (1.778 m)   Wt 84.6 kg (186 lb 8.2 oz)   LMP  (LMP Unknown) Comment: Pt unresponsive at time of x-rays 11/04/16  SpO2 100%   BMI 26.76 kg/m   HEMODYNAMICS:    VENTILATOR SETTINGS: Vent Mode: PSV;CPAP FiO2 (%):  [40 %] 40 % Set Rate:  [16 bmp] 16 bmp Vt Set:  [500 mL] 500 mL PEEP:  [5 cmH20] 5 cmH20 Pressure Support:  [5 cmH20] 5 cmH20 Plateau Pressure:  [11 cmH20-15 cmH20] 11 cmH20  INTAKE / OUTPUT: I/O last 3 completed shifts: In: 62 [I.V.:5356; NG/GT:120; IV Piggyback:785] Out: 1234 [Urine:674; Emesis/NG output:560]  PHYSICAL EXAMINATION: General:  Adult female, no distress  Neuro:  Arousable and follows commands but clearly lethargic HEENT:  [place]/AT, PERRL, EOM-I and MMM Cardiovascular:  RRR, Nl S1/S2, -M/R/G. Lungs:  Coarse BS diffusely Abdomen:  Soft, NT, ND and +BS Musculoskeletal:  -edema and -tenderness Skin:  Warm, dry, intact   LABS:  BMET  Recent Labs Lab 11/04/16 1520 11/05/16 0326 11/06/16 0256  NA 143 143 147*  K 4.2 3.9 3.3*  CL 108 111 114*  CO2  25 22 27   BUN 8 9 10   CREATININE 0.81 0.80 0.71  GLUCOSE 109* 125* 98   Electrolytes  Recent Labs Lab 11/04/16 1520 11/05/16 0326 11/06/16 0256  CALCIUM 9.5 8.3* 8.0*  MG  --  1.8 2.0  PHOS  --  4.1 1.8*   CBC  Recent Labs Lab 11/04/16 1101 11/04/16 1120 11/05/16 0326 11/06/16 0256  WBC 13.5*  --  19.9* 13.6*  HGB 13.8 14.3 14.0 11.6*  HCT 40.4 42.0 41.6 35.4*  PLT 358  --  308 243   Coag's No results for input(s): APTT, INR in the last 168 hours.  Sepsis Markers  Recent Labs Lab 11/04/16 1520 11/04/16 1822 11/05/16 0326 11/06/16 0256  LATICACIDVEN 2.9* 3.5*  --   --   PROCALCITON <0.10  --  0.21 0.12   ABG  Recent Labs Lab 11/04/16 1422 11/05/16 0345  PHART 7.324* 7.392  PCO2ART 50.7* 41.5  PO2ART 501.0* 169*   Liver Enzymes  Recent Labs Lab 11/04/16 1232  AST 62*  ALT 28  ALKPHOS 42  BILITOT 1.1  ALBUMIN 3.8   Cardiac Enzymes  Recent Labs Lab 11/04/16 1520  TROPONINI 0.04*   Glucose  Recent Labs Lab 11/05/16 1201 11/05/16 1547 11/05/16 1946 11/06/16 0014 11/06/16 0333 11/06/16 0852  GLUCAP 135* 108* 102* 106* 90 114*   Imaging Dg Chest Port 1 [address]  Result Date: 11/06/2016 CLINICAL DATA:  Acute respiratory failure. On ventilator. Drug overdose. EXAM: PORTABLE CHEST 1 VIEW COMPARISON:  11/05/2016 FINDINGS: Endotracheal tube and nasogastric tube remain in appropriate position. Mild patchy airspace disease in right lung base shows no significant change. Left lung remains clear. No evidence of pneumothorax or pleural effusion. IMPRESSION: Mild right basilar infiltrate, without significant change. Electronically Signed   By: Myles Rosenthal M.D.   On: 11/06/2016 07:15   STUDIES:  CXR 8/24 > Small right apical pneumothorax, subsegmental atelectasis right upper lobe  CT Head 8/24 > No acute   CULTURES: Blood 8/24 >>NTD U/A 8/24 >> NTD Sputum 8/24 >>NTD  ANTIBIOTICS: Unasyn 8/24 >>  SIGNIFICANT EVENTS: 8/24 > Presents to ED  after being found down with presumed OD  LINES/TUBES: ETT 8/24 >>8/26  DISCUSSION: 31 year old female presents to ED after being found down with suicide note with presumed overdose. Intubated and sedated.   ASSESSMENT / PLAN:  PULMONARY A: Respiratory Insuffiencey in setting of presumed drug overdose  Right Apical Pneumothorax  Possible Aspiration Event  P:   Extubate Ambulate Titrate O2 for sat SLP  IS  CARDIOVASCULAR A:  Bradycardia    QTC Prolongation  HTN  P:  Tele monitoring Hydralazine to Maintain Systolic <180  When able to take PO will start home medications  RENAL A:   Hyperkalemia > Resolved  P:   Trend BMP Replace electrolytes as indicated  BMET in AM CK 7143 dropping to 2671, hold diureses  GASTROINTESTINAL A:   No issues  P:   NPO SLP PPI D/C TF  HEMATOLOGIC A:   No issues  P:  Trend CBC  INFECTIOUS A:   Possible Aspiration Event  P:   Trend WBC and Fever Curve Follow Culture Data Unasyn    ENDOCRINE A:   No issues    P:   Trend Glucose   NEUROLOGIC A:   Acute Encephalopathy secondary to Presumed Drug Overdose > Reported Prozac and Muscle relaxer  SI  H/O Depression  -CT Head Negative  P:   D/C all sedation D/C fentanyl Acetaminophen, salicylate, ETOH all negative Psych consult called Suicide precautions and sitter  FAMILY  - Updates: Mother updated bedside.  - Inter-disciplinary family meet or Palliative Care meeting due by:  11/11/2016  The patient is critically ill with multiple organ systems failure and requires high complexity decision making for assessment and support, frequent evaluation and titration of therapies, application of advanced monitoring technologies and extensive interpretation of multiple databases.   Critical Care Time devoted to patient care services described in this note is  35  Minutes. This time reflects time of care of this signee Dr Koren Bound. This critical care time does not reflect  procedure time, or teaching time or supervisory time of PA/NP/Med student/Med Resident etc but could involve care discussion time.  Alyson Reedy, M.D. [facility] Pulmonary/Critical Care Medicine. Pager: [phone]. After hours pager: [phone].

## 2016-11-06 NOTE — Progress Notes (Signed)
eLink Physician-Brief Progress Note Patient Name: Jennifer [name] DOB: May 10, 1985 MRN: [id number]   Date of Service  11/06/2016  HPI/Events of Note  Tachycardia to the 150s/140s with BP of 146/82 (102).  eICU Interventions  PRN lopressor ordered Continue to monitor patient     Intervention Category Intermediate Interventions: Arrhythmia - evaluation and management  Magaby Rumberger 11/06/2016, 4:53 AM

## 2016-11-06 NOTE — Evaluation (Signed)
Clinical/Bedside Swallow Evaluation Patient Details  Name: Jennifer [name] MRN: [id number] Date of Birth: [birth year]  Today's Date: 11/06/2016 Time: SLP Start Time (ACUTE ONLY): 1505 SLP Stop Time (ACUTE ONLY): 1515 SLP Time Calculation (min) (ACUTE ONLY): 10 min  Past Medical History:  Past Medical History:  Diagnosis Date  . Anxiety   . Arthritis   . Depressed   . Hypertension   . Substance abuse    Past Surgical History: History reviewed. No pertinent surgical history. HPI:  31 year old female with PMH of anxiety, depression, HTN, and Substance abuse. Presented to ED on 8/24 after being found unresponsive at home with a suicide note. Per Family patient has been depression lately with the recent death of her father. Unsure of medication patient ingested, however she is prescribed prozac and had a recent increase in dosage. Was given 6 mg Narcan with minimal improvement. Upon arrival to ED was intubated. UDS negative. Head CT negative. Intubated 8/24-8/26.   Assessment / Plan / Recommendation Clinical Impression  Pt presents with acute, reversible dysphagia and mild risk for aspiration in the setting of recent 3 day intubation. Pt's cough strong, voice is hoarse, low intensity but she is able to attain phonation with cues for loudness. Subtle intermittent throat clearing, delayed cough x1 with ice chips, throat clearing with 3 oz water swallow challenge, suggestive of reduced airway protection. No overt signs of aspiration with nectar thick liquids, pureed or regular solids. Oral stage appears WFL. Recommend initiating dys 3 with nectar thick liquids, medications whole with liquid. SLP will f/u at bedside for tolerance, advancement or to determine need for instrumental assessment if appropriate.  SLP Visit Diagnosis: Dysphagia, unspecified (R13.10)    Aspiration Risk  Mild aspiration risk    Diet Recommendation Dysphagia 3 (Mech soft);Nectar-thick liquid   Liquid Administration via:  Straw;Cup Medication Administration: Whole meds with liquid Supervision: Patient able to self feed;Staff to assist with self feeding Compensations: Slow rate;Small sips/bites    Other  Recommendations Oral Care Recommendations: Oral care BID   Follow up Recommendations None      Frequency and Duration min 1 x/week  1 week       Prognosis Prognosis for Safe Diet Advancement: Good      Swallow Study   General Date of Onset: 11/04/16 HPI: 31 year old female with PMH of anxiety, depression, HTN, and Substance abuse. Presented to ED on 8/24 after being found unresponsive at home with a suicide note. Per Family patient has been depression lately with the recent death of her father. Unsure of medication patient ingested, however she is prescribed prozac and had a recent increase in dosage. Was given 6 mg Narcan with minimal improvement. Upon arrival to ED was intubated. UDS negative. Head CT negative. Intubated 8/24-8/26. Type of Study: Bedside Swallow Evaluation Previous Swallow Assessment: none in chart Diet Prior to this Study: NPO Temperature Spikes Noted: No History of Recent Intubation: Yes Length of Intubations (days): 3 days Date extubated: 11/06/16 Behavior/Cognition: Alert;Cooperative Oral Cavity Assessment: Within Functional Limits Oral Care Completed by SLP: Yes Oral Cavity - Dentition: Adequate natural dentition Vision: Functional for self-feeding Self-Feeding Abilities: Needs assist Patient Positioning: Upright in bed Baseline Vocal Quality: Hoarse;Low vocal intensity Volitional Cough: Strong    Oral/Motor/Sensory Function Overall Oral Motor/Sensory Function: Within functional limits   Ice Chips Ice chips: Impaired Presentation: Spoon Pharyngeal Phase Impairments: Cough - Delayed;Throat Clearing - Immediate   Thin Liquid Thin Liquid: Impaired Pharyngeal  Phase Impairments: Throat Clearing -  Immediate    Nectar Thick Nectar Thick Liquid: Within functional  limits Presentation: Straw   Honey Thick Honey Thick Liquid: Not tested   Puree Puree: Within functional limits Presentation: Spoon   Solid   GO   Rondel Baton, [place], CCC-SLP Speech-Language Pathologist [phone] Solid: Within functional limits        Arlana Lindau 11/06/2016,4:03 PM

## 2016-11-06 NOTE — Procedures (Signed)
Extubation Procedure Note  Patient Details:   Name: Jennifer [name] DOB: 16-May-1985 MRN: [id number]   Airway Documentation:     Evaluation  O2 sats: stable throughout Complications: No apparent complications Patient did tolerate procedure well. Bilateral Breath Sounds: Clear, Diminished   Patient extubated to 4 L [place]. No cuff leak noted prior to extubation. Communicated to MD. Patient still requires coaching to maintain stable RR.   [place] M Tomeika Weinmann 11/06/2016, 11:02 AM

## 2016-11-06 NOTE — Progress Notes (Signed)
eLink Physician-Brief Progress Note Patient Name: Jennifer [name] DOB: 02-16-1986 MRN: [id number]   Date of Service  11/06/2016  HPI/Events of Note  Hypokalemia and hypophos  eICU Interventions  Potassium and phos replaced     Intervention Category Intermediate Interventions: Electrolyte abnormality - evaluation and management  Nakiya Rallis 11/06/2016, 5:12 AM

## 2016-11-07 DIAGNOSIS — M6282 Rhabdomyolysis: Secondary | ICD-10-CM

## 2016-11-07 DIAGNOSIS — F332 Major depressive disorder, recurrent severe without psychotic features: Secondary | ICD-10-CM

## 2016-11-07 DIAGNOSIS — F4320 Adjustment disorder, unspecified: Secondary | ICD-10-CM

## 2016-11-07 DIAGNOSIS — T1491XA Suicide attempt, initial encounter: Secondary | ICD-10-CM

## 2016-11-07 DIAGNOSIS — X838XXS Intentional self-harm by other specified means, sequela: Secondary | ICD-10-CM

## 2016-11-07 DIAGNOSIS — T50902S Poisoning by unspecified drugs, medicaments and biological substances, intentional self-harm, sequela: Secondary | ICD-10-CM

## 2016-11-07 DIAGNOSIS — T428X2A Poisoning by antiparkinsonism drugs and other central muscle-tone depressants, intentional self-harm, initial encounter: Secondary | ICD-10-CM

## 2016-11-07 LAB — CBC
HCT: 31.3 % — ABNORMAL LOW (ref 36.0–46.0)
HEMOGLOBIN: 10.3 g/dL — AB (ref 12.0–15.0)
MCH: 30.5 pg (ref 26.0–34.0)
MCHC: 32.9 g/dL (ref 30.0–36.0)
MCV: 92.6 fL (ref 78.0–100.0)
PLATELETS: 206 10*3/uL (ref 150–400)
RBC: 3.38 MIL/uL — AB (ref 3.87–5.11)
RDW: 13.7 % (ref 11.5–15.5)
WBC: 9.1 10*3/uL (ref 4.0–10.5)

## 2016-11-07 LAB — BASIC METABOLIC PANEL
Anion gap: 5 (ref 5–15)
BUN: 5 mg/dL — AB (ref 6–20)
CHLORIDE: 111 mmol/L (ref 101–111)
CO2: 26 mmol/L (ref 22–32)
CREATININE: 0.5 mg/dL (ref 0.44–1.00)
Calcium: 8 mg/dL — ABNORMAL LOW (ref 8.9–10.3)
GFR calc Af Amer: >60 mL/min (ref >60)
GFR calc non Af Amer: >60 mL/min (ref >60)
GLUCOSE: 99 mg/dL (ref 65–99)
POTASSIUM: 3.2 mmol/L — AB (ref 3.5–5.1)
SODIUM: 142 mmol/L (ref 135–145)

## 2016-11-07 LAB — MAGNESIUM: Magnesium: 1.9 mg/dL (ref 1.7–2.4)

## 2016-11-07 LAB — POCT I-STAT, CHEM 8
BUN: 12 mg/dL (ref 6–20)
CREATININE: 0.7 mg/dL (ref 0.44–1.00)
Calcium, Ion: 1.08 mmol/L — ABNORMAL LOW (ref 1.15–1.40)
Chloride: 110 mmol/L (ref 101–111)
GLUCOSE: 155 mg/dL — AB (ref 65–99)
HCT: 42 % (ref 36.0–46.0)
HEMOGLOBIN: 14.3 g/dL (ref 12.0–15.0)
Potassium: 6.4 mmol/L (ref 3.5–5.1)
Sodium: 140 mmol/L (ref 135–145)
TCO2: 26 mmol/L (ref 22–32)

## 2016-11-07 LAB — PHOSPHORUS: Phosphorus: 3.8 mg/dL (ref 2.5–4.6)

## 2016-11-07 LAB — GLUCOSE, CAPILLARY
GLUCOSE-CAPILLARY: 85 mg/dL (ref 65–99)
GLUCOSE-CAPILLARY: 75 mg/dL (ref 65–99)

## 2016-11-07 MED ORDER — MAGNESIUM SULFATE 2 GM/50ML IV SOLN
2.0000 g | Freq: Once | INTRAVENOUS | Status: AC
  Administered 2016-11-07: 2 g via INTRAVENOUS
  Filled 2016-11-07: qty 50

## 2016-11-07 MED ORDER — AMOXICILLIN-POT CLAVULANATE 875-125 MG PO TABS
1.0000 | ORAL_TABLET | Freq: Two times a day (BID) | ORAL | Status: DC
  Administered 2016-11-07 – 2016-11-08 (×4): 1 via ORAL
  Filled 2016-11-07 (×5): qty 1

## 2016-11-07 MED ORDER — POTASSIUM CHLORIDE CRYS ER 20 MEQ PO TBCR
30.0000 meq | EXTENDED_RELEASE_TABLET | ORAL | Status: AC
  Administered 2016-11-07 (×2): 30 meq via ORAL
  Filled 2016-11-07 (×2): qty 1

## 2016-11-07 NOTE — Progress Notes (Signed)
  Speech Language Pathology Treatment: Dysphagia  Patient Details Name: Jennifer [name] MRN: [id number] DOB: [birth year] Today's Date: 11/07/2016 Time: 6015-6153 SLP Time Calculation (min) (ACUTE ONLY): 20 min  Assessment / Plan / Recommendation Clinical Impression  Pt's voice sounds strong, and she believes that it is mildly raspy but very near her baseline. She had occasional throat clearing before advanced trials of thin liquids that continued intermittently during trials as well. She reports clearing her throat intermittently PTA and that it was a "bad habit". Her voice remains strong and clear, and no additional overt signs of difficulty were noted during rapid intake or mixed consistency boluses. Also taking into account her age and respiratory health PTA, recommend to advance diet to regular textures and thin liquids with use of general aspiration precautions. I also encouraged pt to take cup sips (instead of straw) and to use a volitional cough intermittently. Will f/u for tolerance.   HPI HPI: 31 year old female with PMH of anxiety, depression, HTN, and Substance abuse. Presented to ED on 8/24 after being found unresponsive at home with a suicide note. Per Family patient has been depression lately with the recent death of her father. Unsure of medication patient ingested, however she is prescribed prozac and had a recent increase in dosage. Was given 6 mg Narcan with minimal improvement. Upon arrival to ED was intubated. UDS negative. Head CT negative. Intubated 8/24-8/26.      SLP Plan  Continue with current plan of care       Recommendations  Diet recommendations: Regular;Thin liquid Liquids provided via: Cup;No straw Medication Administration: Whole meds with liquid Supervision: Patient able to self feed;Intermittent supervision to cue for compensatory strategies Compensations: Slow rate;Small sips/bites;Clear throat intermittently Postural Changes and/or Swallow Maneuvers:  Seated upright 90 degrees                Oral Care Recommendations: Oral care BID Follow up Recommendations: None SLP Visit Diagnosis: Dysphagia, unspecified (R13.10) Plan: Continue with current plan of care       GO                Maxcine Ham 11/07/2016, 11:57 AM  Maxcine Ham, M.A. CCC-SLP [phone]

## 2016-11-07 NOTE — Evaluation (Addendum)
Physical Therapy Evaluation Patient Details Name: Jennifer [name] MRN: [id number] DOB: 1985/04/15 Today's Date: 11/07/2016   History of Present Illness  31 year old female with PMH of anxiety, depression, HTN, and Substance abuse. Presented to ED on 8/24 after being found unresponsive at home with a suicide note. Intubated 8/24-8/26  Clinical Impression  Pt pleasant, eager to get OOB and move. Pt from [place] and states she was looking into a mental health facility for rehab prior to father's death and visiting [facility]. Pt reports her plan is for behavioral health and return to home with fiance. Pt with unsteady gait, decreased strength bil LE and impaired function who will benefit from acute therapy to maximize mobility, strength, balance and independence prior to D/C to decrease burden of care.   HR 126 with gait, sats 98% on RA    Follow Up Recommendations Outpatient PT;Supervision for mobility/OOB    Equipment Recommendations  Other (comment) (TBD potential cane)    Recommendations for Other Services OT consult     Precautions / Restrictions Precautions Precautions: Fall Restrictions Weight Bearing Restrictions: No      Mobility  Bed Mobility Overal bed mobility: Modified Independent             General bed mobility comments: increased time with HOB 20 degrees  Transfers Overall transfer level: Needs assistance   Transfers: Sit to/from Stand Sit to Stand: Min guard         General transfer comment: guarding for safety, slightly unsteady with standing  Ambulation/Gait Ambulation/Gait assistance: Min guard Ambulation Distance (Feet): 300 Feet Assistive device: None Gait Pattern/deviations: Step-through pattern;Decreased stride length   Gait velocity interpretation: Below normal speed for age/gender General Gait Details: pt with unsteady gait, veering right and left but no overt LOB, pt aware of impaired gait and need for assist with mobility. cues for safety and  balance  Stairs            Wheelchair Mobility    Modified Rankin (Stroke Patients Only)       Balance Overall balance assessment: Needs assistance   Sitting balance-Leahy Scale: Good       Standing balance-Leahy Scale: Fair                               Pertinent Vitals/Pain Pain Assessment: No/denies pain    Home Living Family/patient expects to be discharged to:: Private residence Living Arrangements: Spouse/significant other Available Help at Discharge: Family;Available 24 hours/day Type of Home: House Home Access: Stairs to enter   [employer] of Steps: 3 Home Layout: One level Home Equipment: None      Prior Function Level of Independence: Independent         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 Extremity Assessment Upper Extremity Assessment: Generalized weakness    Lower Extremity Assessment Lower Extremity Assessment: Generalized weakness (noted abrasion to left knee and pt reports soreness)    Cervical / Trunk Assessment Cervical / Trunk Assessment: Normal  Communication   Communication: No difficulties  Cognition Arousal/Alertness: Awake/alert Behavior During Therapy: Flat affect Overall Cognitive Status: Within Functional Limits for tasks assessed  General Comments      Exercises     Assessment/Plan    PT Assessment Patient needs continued PT services  PT Problem List Decreased strength;Decreased mobility;Decreased activity tolerance;Decreased balance;Decreased knowledge of use of DME       PT Treatment Interventions Gait training;Therapeutic exercise;Patient/family education;Stair training;Balance training;Functional mobility training;DME instruction;Therapeutic activities    PT Goals (Current goals can be found in the Care Plan section)  Acute Rehab PT Goals Patient Stated  Goal: return to [place] and work PT Goal Formulation: With patient/family Time For Goal Achievement: 11/21/16 Potential to Achieve Goals: Good    Frequency Min 3X/week   Barriers to discharge        Co-evaluation               AM-PAC PT "6 Clicks" Daily Activity  Outcome Measure Difficulty turning over in bed (including adjusting bedclothes, sheets and blankets)?: None Difficulty moving from lying on back to sitting on the side of the bed? : None Difficulty sitting down on and standing up from a chair with arms (e.g., wheelchair, bedside commode, etc,.)?: A Little Help needed moving to and from a bed to chair (including a wheelchair)?: A Little Help needed walking in hospital room?: A Little Help needed climbing 3-5 steps with a railing? : A Little 6 Click Score: 20    End of Session Equipment Utilized During Treatment: Gait belt Activity Tolerance: Patient tolerated treatment well Patient left: in chair;with call bell/phone within reach;with nursing/sitter in room;with family/visitor present Nurse Communication: Mobility status PT Visit Diagnosis: Other abnormalities of gait and mobility (R26.89)    Time: 3606-7703 PT Time Calculation (min) (ACUTE ONLY): 24 min   Charges:   PT Evaluation $PT Eval Moderate Complexity: 1 Mod PT Treatments $Gait Training: 8-22 mins   PT G Codes:        Delaney Meigs, PT [phone]   Cable Fearn B Kylle Lall 11/07/2016, 9:22 AM

## 2016-11-07 NOTE — Progress Notes (Signed)
[facility] ADULT ICU REPLACEMENT PROTOCOL FOR AM LAB REPLACEMENT ONLY  The patient does apply for the [facility] Adult ICU Electrolyte Replacment Protocol based on the criteria listed below:   1. Is GFR >/= 40 ml/min? Yes.    Patient's GFR today is >60 2. Is urine output >/= 0.5 ml/kg/hr for the last 6 hours? Yes.  Patient's UOP is 0.6 ml/kg/hr 3. Is BUN < 60 mg/dL? Yes.    Patient's BUN today is 5 4. Abnormal electrolyte(s): K+3.2 5. Ordered repletion with: Protocol 6. If a panic level lab has been reported, has the CCM MD in charge been notified? No..   Physician:  Arsenio Loader Hilliard 11/07/2016 4:16 AM

## 2016-11-07 NOTE — Progress Notes (Signed)
PULMONARY / CRITICAL CARE MEDICINE   Name: Jennifer [name] MRN: [id number] DOB: 02-01-1986    ADMISSION DATE:  11/04/2016 CONSULTATION DATE:  11/04/2016  REFERRING MD:  Dr. Jacqulyn Bath   CHIEF COMPLAINT: Overdose   HISTORY OF PRESENT ILLNESS:   31 year old female with PMH of anxiety, depression, HTN, and Substance abuse  Presents to ED on 8/24 after being found unresponsive at home with a suicide note. Per Family patient has been depression lately with the recent death of her father. Unsure of medication patient ingested, however she is prescribed prozac and had a recent increase in dosage. Was given 6 mg Narcan with minimal improvement. Upon arrival to ED was intubated. UDS negative. Head CT negative. PCCM asked to admit.   SUBJECTIVE:  No events overnight, much more cooperative this AM  VITAL SIGNS: BP 117/80 (BP Location: Right Arm)   Pulse 94   Temp 98.6 F (37 C) (Oral)   Resp 16   Ht 5\' 10"  (1.778 m)   Wt 85.8 kg (189 lb 2.5 oz)   LMP  (LMP Unknown) Comment: Pt unresponsive at time of x-rays 11/04/16  SpO2 100%   BMI 27.14 kg/m   HEMODYNAMICS:    VENTILATOR SETTINGS:    INTAKE / OUTPUT: I/O last 3 completed shifts: In: 4373.3 [P.O.:120; I.V.:3448.3; NG/GT:120; IV Piggyback:685] Out: 1779 [Urine:1619; Emesis/NG output:160]  PHYSICAL EXAMINATION: General:  Adult female, no distress, awake and interactive Neuro:  Arousable and follows commands, appropriate HEENT:  [place]/AT, PERRL, EOM-I and MMM Cardiovascular:  RRR, Nl S1/S2, -M/R/G. Lungs:  CTA bilaterally Abdomen:  Soft, NT, ND and +BS Musculoskeletal:  -edema and -tenderness Skin:  Warm, dry, intact   LABS:  BMET  Recent Labs Lab 11/05/16 0326 11/06/16 0256 11/07/16 0217  NA 143 147* 142  K 3.9 3.3* 3.2*  CL 111 114* 111  CO2 22 27 26   BUN 9 10 5*  CREATININE 0.80 0.71 0.50  GLUCOSE 125* 98 99   Electrolytes  Recent Labs Lab 11/05/16 0326 11/06/16 0256 11/07/16 0217  CALCIUM 8.3* 8.0* 8.0*   MG 1.8 2.0 1.9  PHOS 4.1 1.8* 3.8   CBC  Recent Labs Lab 11/05/16 0326 11/06/16 0256 11/07/16 0217  WBC 19.9* 13.6* 9.1  HGB 14.0 11.6* 10.3*  HCT 41.6 35.4* 31.3*  PLT 308 243 206   Coag's No results for input(s): APTT, INR in the last 168 hours.  Sepsis Markers  Recent Labs Lab 11/04/16 1520 11/04/16 1822 11/05/16 0326 11/06/16 0256  LATICACIDVEN 2.9* 3.5*  --   --   PROCALCITON <0.10  --  0.21 0.12   ABG  Recent Labs Lab 11/04/16 1422 11/05/16 0345  PHART 7.324* 7.392  PCO2ART 50.7* 41.5  PO2ART 501.0* 169*   Liver Enzymes  Recent Labs Lab 11/04/16 1232  AST 62*  ALT 28  ALKPHOS 42  BILITOT 1.1  ALBUMIN 3.8   Cardiac Enzymes  Recent Labs Lab 11/04/16 1520  TROPONINI 0.04*   Glucose  Recent Labs Lab 11/06/16 1228 11/06/16 1625 11/06/16 2013 11/06/16 2351 11/07/16 0340 11/07/16 0817  GLUCAP 97 93 92 86 85 75   Imaging No results found. STUDIES:  CXR 8/24 > Small right apical pneumothorax, subsegmental atelectasis right upper lobe  CT Head 8/24 > No acute   CULTURES: Blood 8/24 >>NTD U/A 8/24 >> NTD Sputum 8/24 >>NTD  ANTIBIOTICS: Unasyn 8/24 >>8/27 Augmentin 8/27>>>8/30  SIGNIFICANT EVENTS: 8/24 > Presents to ED after being found down with presumed OD  LINES/TUBES: ETT 8/24 >>  8/26  I reviewed CXR myself, no acute disease noted  DISCUSSION: 31 year old female presents to ED after being found down with suicide note with presumed overdose. Intubated and sedated.   ASSESSMENT / PLAN:  PULMONARY A: Respiratory Insuffiencey in setting of presumed drug overdose  Right Apical Pneumothorax  Possible Aspiration Event  P:   Ambulate Titrate O2 for sat IS  CARDIOVASCULAR A:  Bradycardia    QTC Prolongation  HTN  P:  Tele monitoring Hydralazine to Maintain Systolic <180  When able to take PO will start home medications  RENAL A:   Hyperkalemia > Resolved  P:   Trend BMP Replace electrolytes as indicated   BMET in AM CK 7143 dropping to 2671, hold diureses KVO IVF  GASTROINTESTINAL A:   No issues  P:   SLP today, dysphagia 3 can probably be liberated to a regular diet D/C PPI D/C TF  HEMATOLOGIC A:   No issues  P:  Trend CBC  INFECTIOUS A:   Possible Aspiration Event  P:   Trend WBC and Fever Curve Follow Culture Data D/C Unasyn and start Augmentin for 3 more days  ENDOCRINE A:   No issues    P:   Trend Glucose   NEUROLOGIC A:   Acute Encephalopathy secondary to Presumed Drug Overdose > Reported Prozac and Muscle relaxer  SI  H/O Depression  -CT Head Negative  P:   D/C all sedation D/C fentanyl Acetaminophen, salicylate, ETOH all negative Psych consult called, to be seen today Suicide precautions and sitter  FAMILY  - Updates: Mother and patient updated bedside.  - Inter-disciplinary family meet or Palliative Care meeting due by:  11/11/2016  Discussed with PCCM-NP and TRH-MD  Transfer to med-surg and to [facility] service with PCCM off 8/28  Alyson Reedy, M.D. [facility] Pulmonary/Critical Care Medicine. Pager: [phone]. After hours pager: [phone].

## 2016-11-07 NOTE — Consult Note (Signed)
[facility] Face-to-Face Psychiatry Consult   Reason for Consult:  Intentional drug overdose as a suicide attempt Referring Physician:  Dr. Molli Knock Patient Identification: Jennifer [name] MRN:  [id number] Principal Diagnosis: <principal problem not specified> Diagnosis:   Patient Active Problem List   Diagnosis Date Noted  . Overdose [T50.901A] 11/04/2016    Total Time spent with patient: 1 hour  Subjective:   Jennifer [name] is a 31 y.o. female patient admitted with overdose.  HPI: Jennifer [name] is a 31 years old female admitted to call the medical intensive care unit with altered mental status followed by intentional drug overdose as a suicidal attempt. Patient required intubation on admission and extubated at this time. Patient stated that she took baclofen 8 tablets at a time 4 and then she does not remember what happened and woke up in the hospital. Patient has a family members who are supportive to her reportedly mother, sister and boyfriend. Patient reported she has been depressed over some time and has been down out of her medication since came to the [place] from [place], [place] secondary to her father's death. Patient's father suffered with chronic myeloid leukemia. Patient also reported in May 2018 her best friend shot herself after had a argument with her boyfriend and a week later her 47 years old dog was died. Patient endorses being depressed and does not know how to lead her life so she ended up trying to take her life at this time. Patient reportedly living [place], [place] 4 last 10 years and her family is from [place]. Patient has been contracting for safety while in the hospital and wants to restart her medication and willing to participate in behavioral Health Center voluntarily. Patient is also hoping to get a better job when she called back to [place] after the whole treatment disorder.   Past Psychiatric History: Depression, anxiety and substance abuse. Reportedly patient was  treated with Wellbutrin which causes increased heart rate, Celexa which made her sedation and current medication Prozac not working and she was treated with the Effexor XL and Viibryd int the past.  Risk to Self: Is patient at risk for suicide?: Yes Risk to Others:   Prior Inpatient Therapy:   Prior Outpatient Therapy:    Past Medical History:  Past Medical History:  Diagnosis Date  . Anxiety   . Arthritis   . Depressed   . Hypertension   . Substance abuse    History reviewed. No pertinent surgical history. Family History: History reviewed. No pertinent family history. Family Psychiatric  History: Unknown Social History:  History  Alcohol use Not on file     History  Drug use: Unknown    Social History   Social History  . Marital status: Single    Spouse name: N/A  . Number of children: N/A  . Years of education: N/A   Social History Main Topics  . Smoking status: None  . Smokeless tobacco: None  . Alcohol use None  . Drug use: Unknown  . Sexual activity: Not Asked   Other Topics Concern  . None   Social History Narrative  . None   Additional Social History:    Allergies:  No Known Allergies  Labs:  Results for orders placed or performed during the hospital encounter of 11/04/16 (from the past 48 hour(s))  Glucose, capillary     Status: Abnormal   Collection Time: 11/05/16  3:47 PM  Result Value Ref Range   Glucose-Capillary 108 (H) 65 - 99 mg/dL  Comment 1 Capillary Specimen   Glucose, capillary     Status: Abnormal   Collection Time: 11/05/16  7:46 PM  Result Value Ref Range   Glucose-Capillary 102 (H) 65 - 99 mg/dL   Comment 1 Capillary Specimen   Glucose, capillary     Status: Abnormal   Collection Time: 11/06/16 12:14 AM  Result Value Ref Range   Glucose-Capillary 106 (H) 65 - 99 mg/dL   Comment 1 Capillary Specimen   Procalcitonin     Status: None   Collection Time: 11/06/16  2:56 AM  Result Value Ref Range   Procalcitonin 0.12 ng/mL     Comment:        Interpretation: PCT (Procalcitonin) <= 0.5 ng/mL: Systemic infection (sepsis) is not likely. Local bacterial infection is possible. (NOTE)         ICU PCT Algorithm               Non ICU PCT Algorithm    ----------------------------     ------------------------------         PCT < 0.25 ng/mL                 PCT < 0.1 ng/mL     Stopping of antibiotics            Stopping of antibiotics       strongly encouraged.               strongly encouraged.    ----------------------------     ------------------------------       PCT level decrease by               PCT < 0.25 ng/mL       >= 80% from peak PCT       OR PCT 0.25 - 0.5 ng/mL          Stopping of antibiotics                                             encouraged.     Stopping of antibiotics           encouraged.    ----------------------------     ------------------------------       PCT level decrease by              PCT >= 0.25 ng/mL       < 80% from peak PCT        AND PCT >= 0.5 ng/mL            Continuin g antibiotics                                              encouraged.       Continuing antibiotics            encouraged.    ----------------------------     ------------------------------     PCT level increase compared          PCT > 0.5 ng/mL         with peak PCT AND          PCT >= 0.5 ng/mL             Escalation of antibiotics  strongly encouraged.      Escalation of antibiotics        strongly encouraged.   CK     Status: Abnormal   Collection Time: 11/06/16  2:56 AM  Result Value Ref Range   Total CK 2,671 (H) 38 - 234 U/L  CBC     Status: Abnormal   Collection Time: 11/06/16  2:56 AM  Result Value Ref Range   WBC 13.6 (H) 4.0 - 10.5 K/uL   RBC 3.81 (L) 3.87 - 5.11 MIL/uL   Hemoglobin 11.6 (L) 12.0 - 15.0 g/dL   HCT 35.4 (L) 36.0 - 46.0 %   MCV 92.9 78.0 - 100.0 fL   MCH 30.4 26.0 - 34.0 pg   MCHC 32.8 30.0 - 36.0 g/dL   RDW 13.9 11.5 - 15.5 %    Platelets 243 150 - 400 K/uL  Basic metabolic panel     Status: Abnormal   Collection Time: 11/06/16  2:56 AM  Result Value Ref Range   Sodium 147 (H) 135 - 145 mmol/L   Potassium 3.3 (L) 3.5 - 5.1 mmol/L   Chloride 114 (H) 101 - 111 mmol/L   CO2 27 22 - 32 mmol/L   Glucose, Bld 98 65 - 99 mg/dL   BUN 10 6 - 20 mg/dL   Creatinine, Ser 0.71 0.44 - 1.00 mg/dL   Calcium 8.0 (L) 8.9 - 10.3 mg/dL   GFR calc non Af Amer >60 >60 mL/min   GFR calc Af Amer >60 >60 mL/min    Comment: (NOTE) The eGFR has been calculated using the CKD EPI equation. This calculation has not been validated in all clinical situations. eGFR's persistently <60 mL/min signify possible Chronic Kidney Disease.    Anion gap 6 5 - 15  Magnesium     Status: None   Collection Time: 11/06/16  2:56 AM  Result Value Ref Range   Magnesium 2.0 1.7 - 2.4 mg/dL  Phosphorus     Status: Abnormal   Collection Time: 11/06/16  2:56 AM  Result Value Ref Range   Phosphorus 1.8 (L) 2.5 - 4.6 mg/dL  Glucose, capillary     Status: None   Collection Time: 11/06/16  3:33 AM  Result Value Ref Range   Glucose-Capillary 90 65 - 99 mg/dL   Comment 1 Capillary Specimen   Glucose, capillary     Status: Abnormal   Collection Time: 11/06/16  8:52 AM  Result Value Ref Range   Glucose-Capillary 114 (H) 65 - 99 mg/dL   Comment 1 Capillary Specimen    Comment 2 Notify RN   Glucose, capillary     Status: None   Collection Time: 11/06/16 12:28 PM  Result Value Ref Range   Glucose-Capillary 97 65 - 99 mg/dL   Comment 1 Capillary Specimen    Comment 2 Notify RN   Glucose, capillary     Status: None   Collection Time: 11/06/16  4:25 PM  Result Value Ref Range   Glucose-Capillary 93 65 - 99 mg/dL   Comment 1 Capillary Specimen    Comment 2 Notify RN   Glucose, capillary     Status: None   Collection Time: 11/06/16  8:13 PM  Result Value Ref Range   Glucose-Capillary 92 65 - 99 mg/dL   Comment 1 Capillary Specimen   Glucose, capillary      Status: None   Collection Time: 11/06/16 11:51 PM  Result Value Ref Range   Glucose-Capillary 86 65 - 99 mg/dL  Comment 1 Notify RN   CBC     Status: Abnormal   Collection Time: 11/07/16  2:17 AM  Result Value Ref Range   WBC 9.1 4.0 - 10.5 K/uL   RBC 3.38 (L) 3.87 - 5.11 MIL/uL   Hemoglobin 10.3 (L) 12.0 - 15.0 g/dL   HCT 31.3 (L) 36.0 - 46.0 %   MCV 92.6 78.0 - 100.0 fL   MCH 30.5 26.0 - 34.0 pg   MCHC 32.9 30.0 - 36.0 g/dL   RDW 13.7 11.5 - 15.5 %   Platelets 206 150 - 400 K/uL  Basic metabolic panel     Status: Abnormal   Collection Time: 11/07/16  2:17 AM  Result Value Ref Range   Sodium 142 135 - 145 mmol/L   Potassium 3.2 (L) 3.5 - 5.1 mmol/L   Chloride 111 101 - 111 mmol/L   CO2 26 22 - 32 mmol/L   Glucose, Bld 99 65 - 99 mg/dL   BUN 5 (L) 6 - 20 mg/dL   Creatinine, Ser 0.50 0.44 - 1.00 mg/dL   Calcium 8.0 (L) 8.9 - 10.3 mg/dL   GFR calc non Af Amer >60 >60 mL/min   GFR calc Af Amer >60 >60 mL/min    Comment: (NOTE) The eGFR has been calculated using the CKD EPI equation. This calculation has not been validated in all clinical situations. eGFR's persistently <60 mL/min signify possible Chronic Kidney Disease.    Anion gap 5 5 - 15  Magnesium     Status: None   Collection Time: 11/07/16  2:17 AM  Result Value Ref Range   Magnesium 1.9 1.7 - 2.4 mg/dL  Phosphorus     Status: None   Collection Time: 11/07/16  2:17 AM  Result Value Ref Range   Phosphorus 3.8 2.5 - 4.6 mg/dL  Glucose, capillary     Status: None   Collection Time: 11/07/16  3:40 AM  Result Value Ref Range   Glucose-Capillary 85 65 - 99 mg/dL   Comment 1 Notify RN   Glucose, capillary     Status: None   Collection Time: 11/07/16  8:17 AM  Result Value Ref Range   Glucose-Capillary 75 65 - 99 mg/dL   Comment 1 Capillary Specimen     Current Facility-Administered Medications  Medication Dose Route Frequency Provider Last Rate Last Dose  . 0.9 %  sodium chloride infusion  250 mL Intravenous  PRN Omar Person, NP      . amoxicillin-clavulanate (AUGMENTIN) 875-125 MG per tablet 1 tablet  1 tablet Oral Q12H Rush Farmer, MD   1 tablet at 11/07/16 1149  . heparin injection 5,000 Units  5,000 Units Subcutaneous Q8H Omar Person, NP   5,000 Units at 11/07/16 5409  . RESOURCE THICKENUP CLEAR   Oral PRN Rush Farmer, MD        Musculoskeletal: Strength & Muscle Tone: decreased Gait & Station: unable to stand Patient leans: N/A  Psychiatric Specialty Exam: Physical Exam as per history and physical   ROS facial injury secondary to fall and has a decreased psychomotor activity. Denied nausea, vomiting, abdominal pain, shortness of breath and swelling.  No Fever-chills, No Headache, No changes with Vision or hearing, reports vertigo No problems swallowing food or Liquids, No Chest pain, Cough or Shortness of Breath, No Abdominal pain, No Nausea or Vommitting, Bowel movements are regular, No Blood in stool or Urine, No dysuria, No new skin rashes or bruises, No new joints pains-aches,  No new weakness, tingling, numbness in any extremity, No recent weight gain or loss, No polyuria, polydypsia or polyphagia,  A full 10 point Review of Systems was done, except as stated above, all other Review of Systems were negative.  Blood pressure 117/80, pulse 88, temperature 98.5 F (36.9 C), temperature source Oral, resp. rate 12, height '5\' 10"'$  (1.778 m), weight 85.8 kg (189 lb 2.5 oz), SpO2 99 %.Body mass index is 27.14 kg/m.  General Appearance: Guarded  Eye Contact:  Good  Speech:  Clear and Coherent and Slow  Volume:  Decreased  Mood:  Depressed and Worthless  Affect:  Constricted and Depressed  Thought Process:  Coherent and Goal Directed  Orientation:  Full (Time, Place, and Person)  Thought Content:  Rumination  Suicidal Thoughts:  Yes.  with intent/plan  Homicidal Thoughts:  No  Memory:  Immediate;   Good Recent;   Fair Remote;   Fair  Judgement:   Impaired  Insight:  Fair  Psychomotor Activity:  Decreased  Concentration:  Concentration: Fair and Attention Span: Fair  Recall:  Good  Fund of Knowledge:  Good  Language:  Good  Akathisia:  Negative  Handed:  Right  AIMS (if indicated):     Assets:  Communication Skills Desire for Improvement Financial Resources/Insurance Housing Intimacy Leisure Time Physical Health Resilience Social Support Talents/Skills Transportation Vocational/Educational  ADL's:  Intact  Cognition:  WNL  Sleep:        Treatment Plan Summary: 31 years old female with a history of depression admitted to medical intensive care unit per status post intentional overdose of baclofen 32 tablets as a suicide attempt.  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 Supportive therapy provided about ongoing stressors.  Ambrose Finland, MD 11/07/2016 12:40 PM

## 2016-11-08 ENCOUNTER — Encounter (HOSPITAL_COMMUNITY): Payer: Self-pay | Admitting: General Practice

## 2016-11-08 DIAGNOSIS — F339 Major depressive disorder, recurrent, unspecified: Secondary | ICD-10-CM

## 2016-11-08 DIAGNOSIS — T17908A Unspecified foreign body in respiratory tract, part unspecified causing other injury, initial encounter: Secondary | ICD-10-CM | POA: Diagnosis present

## 2016-11-08 DIAGNOSIS — R9431 Abnormal electrocardiogram [ECG] [EKG]: Secondary | ICD-10-CM

## 2016-11-08 DIAGNOSIS — J9601 Acute respiratory failure with hypoxia: Secondary | ICD-10-CM | POA: Diagnosis present

## 2016-11-08 DIAGNOSIS — R45851 Suicidal ideations: Secondary | ICD-10-CM

## 2016-11-08 DIAGNOSIS — F332 Major depressive disorder, recurrent severe without psychotic features: Secondary | ICD-10-CM | POA: Diagnosis present

## 2016-11-08 DIAGNOSIS — G9341 Metabolic encephalopathy: Secondary | ICD-10-CM

## 2016-11-08 DIAGNOSIS — T50902A Poisoning by unspecified drugs, medicaments and biological substances, intentional self-harm, initial encounter: Secondary | ICD-10-CM | POA: Diagnosis present

## 2016-11-08 DIAGNOSIS — J939 Pneumothorax, unspecified: Secondary | ICD-10-CM

## 2016-11-08 DIAGNOSIS — T17908D Unspecified foreign body in respiratory tract, part unspecified causing other injury, subsequent encounter: Secondary | ICD-10-CM

## 2016-11-08 DIAGNOSIS — E876 Hypokalemia: Secondary | ICD-10-CM | POA: Clinically undetermined

## 2016-11-08 LAB — FERRITIN: FERRITIN: 84 ng/mL (ref 11–307)

## 2016-11-08 LAB — CBC
HEMATOCRIT: 31.3 % — AB (ref 36.0–46.0)
HEMOGLOBIN: 10.4 g/dL — AB (ref 12.0–15.0)
MCH: 29.6 pg (ref 26.0–34.0)
MCHC: 33.2 g/dL (ref 30.0–36.0)
MCV: 89.2 fL (ref 78.0–100.0)
Platelets: 232 10*3/uL (ref 150–400)
RBC: 3.51 MIL/uL — ABNORMAL LOW (ref 3.87–5.11)
RDW: 12.7 % (ref 11.5–15.5)
WBC: 6.7 10*3/uL (ref 4.0–10.5)

## 2016-11-08 LAB — BASIC METABOLIC PANEL
Anion gap: 7 (ref 5–15)
BUN: 6 mg/dL (ref 6–20)
CHLORIDE: 108 mmol/L (ref 101–111)
CO2: 26 mmol/L (ref 22–32)
CREATININE: 0.55 mg/dL (ref 0.44–1.00)
Calcium: 8.2 mg/dL — ABNORMAL LOW (ref 8.9–10.3)
GFR calc Af Amer: >60 mL/min (ref >60)
GFR calc non Af Amer: >60 mL/min (ref >60)
Glucose, Bld: 86 mg/dL (ref 65–99)
Potassium: 3.4 mmol/L — ABNORMAL LOW (ref 3.5–5.1)
Sodium: 141 mmol/L (ref 135–145)

## 2016-11-08 LAB — MAGNESIUM: Magnesium: 1.9 mg/dL (ref 1.7–2.4)

## 2016-11-08 LAB — PHOSPHORUS: Phosphorus: 4.1 mg/dL (ref 2.5–4.6)

## 2016-11-08 LAB — IRON AND TIBC
IRON: 68 ug/dL (ref 28–170)
Saturation Ratios: 30 % (ref 10.4–31.8)
TIBC: 228 ug/dL — AB (ref 250–450)
UIBC: 160 ug/dL

## 2016-11-08 LAB — VITAMIN B12: Vitamin B-12: 828 pg/mL (ref 180–914)

## 2016-11-08 LAB — FOLATE: Folate: 23.8 ng/mL (ref >5.9)

## 2016-11-08 MED ORDER — ONDANSETRON HCL 4 MG/5ML PO SOLN
4.0000 mg | Freq: Three times a day (TID) | ORAL | Status: DC | PRN
  Filled 2016-11-08: qty 5

## 2016-11-08 MED ORDER — POTASSIUM CHLORIDE CRYS ER 20 MEQ PO TBCR
40.0000 meq | EXTENDED_RELEASE_TABLET | Freq: Once | ORAL | Status: AC
  Administered 2016-11-08: 40 meq via ORAL
  Filled 2016-11-08: qty 2

## 2016-11-08 MED ORDER — AMOXICILLIN-POT CLAVULANATE 875-125 MG PO TABS: 1.0000 | ORAL_TABLET | Freq: Two times a day (BID) | ORAL | 0 refills | Status: DC

## 2016-11-08 MED ORDER — DULOXETINE HCL 30 MG PO CPEP
30.0000 mg | ORAL_CAPSULE | Freq: Every day | ORAL | Status: DC
  Administered 2016-11-08: 30 mg via ORAL
  Filled 2016-11-08: qty 1

## 2016-11-08 MED ORDER — ONDANSETRON HCL 4 MG/5ML PO SOLN: 4.0000 mg | Freq: Three times a day (TID) | ORAL | 0 refills | Status: DC | PRN

## 2016-11-08 MED ORDER — DULOXETINE HCL 30 MG PO CPEP: 30.0000 mg | ORAL_CAPSULE | Freq: Every day | ORAL | 0 refills | Status: DC

## 2016-11-08 MED ORDER — POLYETHYLENE GLYCOL 3350 17 G PO PACK
17.0000 g | PACK | Freq: Every day | ORAL | Status: DC | PRN
  Administered 2016-11-08: 17 g via ORAL
  Filled 2016-11-08: qty 1

## 2016-11-08 MED ORDER — ACETAMINOPHEN 325 MG PO TABS: 650.0000 mg | ORAL_TABLET | Freq: Four times a day (QID) | ORAL | Status: DC | PRN

## 2016-11-08 NOTE — Progress Notes (Signed)
Patient voluntary consent form signed, faxed and placed in chart. Awaiting discharge summery from provider to discharge.

## 2016-11-08 NOTE — Progress Notes (Signed)
Patient has been accepted to [facility].  Accepting physician is Dr. Ardyth Harps.  Attending Physician will be Dr. Jennet Maduro.  Patient has been assigned to room 302, by [facility] Charge Nurse [place] F.  Call report to [phone].  Representative/Transfer Coordinator is Calvin Patient pre-admitted by [facility] Patient Access Ola Spurr)  Patient can be transported anytime VIA [facility] [phone].  Patient will need voluntary consent form signed and faxed to [facility] at [phone] Discharge summary must be completed.    Stacy Gardner, [facility] Emergency Room Clinical Social Worker [phone]

## 2016-11-08 NOTE — Progress Notes (Signed)
Nutrition Follow-up  DOCUMENTATION CODES:   Not applicable  INTERVENTION:   -Continue with regular diet  NUTRITION DIAGNOSIS:   Inadequate oral intake related to inability to eat as evidenced by NPO status.  Resolved  No new nutrition at this time  GOAL:   Patient will meet greater than or equal to 90% of their needs  Met  MONITOR:   PO intake, Labs, Weight trends, Skin, I & O's  REASON FOR ASSESSMENT:   Ventilator    ASSESSMENT:   31 year old female presents to ED after being found down with suicide note with presumed overdose. Intubated and sedated.   8/26- extubated, s/p BSE- advanced to dysphagia 3 diet with nectar thick liquids 8/27- advanced to regular diet with thin liquids  Pt ambulating hallways with sitter at time of visit. Pt with good appetite; consuming 75% of meals.   CSW following for inpatient psychiatric hospitalization.  Labs reviewed: K: 3.4.   Diet Order:  Diet regular Room service appropriate? Yes; Fluid consistency: Thin  Skin:  Reviewed, no issues  Last BM:  PTA  Height:   Ht Readings from Last 1 Encounters:  11/04/16 '5\' 10"'$  (1.778 m)    Weight:   Wt Readings from Last 1 Encounters:  11/08/16 192 lb 14.4 oz (87.5 kg)    Ideal Body Weight:  68.1 kg  BMI:  Body mass index is 27.68 kg/m.  Estimated Nutritional Needs:   Kcal:  1700-1900  Protein:  90-105 grams  Fluid:  1.7-1.9 L  EDUCATION NEEDS:   No education needs identified at this time  Jayma Volpi A. Jimmye Norman, RD, LDN, CDE Pager: [phone] After hours Pager: [phone]

## 2016-11-08 NOTE — Progress Notes (Addendum)
CSW assisting with psych placement referrals- referrals sent to following facilities for review:  [facility]  Please contact Cristobal Goldmann who is 5W unit CSW regarding this pt- [phone]

## 2016-11-08 NOTE — Progress Notes (Signed)
  Speech Language Pathology Treatment: Dysphagia  Patient Details Name: Jennifer [name] MRN: [id number] DOB: 05/24/1985 Today's Date: 11/08/2016 Time: 2725-3664 SLP Time Calculation (min) (ACUTE ONLY): 14 min  Assessment / Plan / Recommendation Clinical Impression  Pt's voice sounds even stronger today and she has no throat clearing observed with thin liquids via straw. She says that she has been ordering the nectar thick liquids anyway as a precaution and because she likes them. SLP encouraged her to try thin liquids. Will f/u briefly to assess tolerance once she starts taking in thin liquids, although I anticipate that at this point she will not have much trouble post-extubation.   HPI HPI: 31 year old female with PMH of anxiety, depression, HTN, and Substance abuse. Presented to ED on 8/24 after being found unresponsive at home with a suicide note. Per Family patient has been depression lately with the recent death of her father. Unsure of medication patient ingested, however she is prescribed prozac and had a recent increase in dosage. Was given 6 mg Narcan with minimal improvement. Upon arrival to ED was intubated. UDS negative. Head CT negative. Intubated 8/24-8/26.      SLP Plan  Continue with current plan of care       Recommendations  Diet recommendations: Regular;Thin liquid Liquids provided via: Cup;Straw Medication Administration: Whole meds with liquid Supervision: Patient able to self feed;Intermittent supervision to cue for compensatory strategies Compensations: Slow rate;Small sips/bites;Clear throat intermittently Postural Changes and/or Swallow Maneuvers: Seated upright 90 degrees                Oral Care Recommendations: Oral care BID Follow up Recommendations: None SLP Visit Diagnosis: Dysphagia, unspecified (R13.10) Plan: Continue with current plan of care       GO                Maxcine Ham 11/08/2016, 4:24 PM  Maxcine Ham, M.A.  CCC-SLP [phone]

## 2016-11-08 NOTE — Progress Notes (Signed)
Chaplain attempted visit with the patient.  Chaplain presented to the patient's room, she had visitors at the time, unable at this visit with the patient for emotional support, will follow up at a later time. Chaplain Janell Quiet [phone]

## 2016-11-08 NOTE — BH Assessment (Signed)
Patient has been accepted to [facility].  Accepting physician is Dr. Ardyth Harps.  Attending Physician will be Dr. Jennet Maduro.  Patient has been assigned to room 302, by [facility] Charge Nurse [place] F.  Call report to [phone].  Representative/Transfer Coordinator is Warden/ranger Patient pre-admitted by [facility] Patient Access Ola Spurr)   [facility] Staff ([phone], Social Worker & [phone], RN) are aware.

## 2016-11-08 NOTE — Care Management Note (Signed)
Case Management Note  Patient Details  Name: Jennifer [name] MRN: [id number] Date of Birth: [birth year]  Subjective/Objective:       Pt presents with suicide attempt , 32 baclofens.  PMH of anxiety, depression, HTN, and Substance abuse, recent death of father. Resides in  [place].            Jemica Weick (mom)     [phone]       PCP:   Action/Plan: Psych following...plan is to d/c to inpt psych facility @ d/c when medically stable. CSW managing disposition to facilty . CM following as needs present.  Expected Discharge Date:                  Expected Discharge Plan:  Psychiatric Hospital  In-House Referral:  Clinical Social Work  Discharge planning Services  CM Consult  Post Acute Care Choice:    Choice offered to:     DME Arranged:    DME Agency:     HH Arranged:    HH Agency:     Status of Service:  In process, will continue to follow  If discussed at Long Length of Stay Meetings, dates discussed:    Additional Comments:  Epifanio Lesches, RN 11/08/2016, 10:46 AM

## 2016-11-08 NOTE — Progress Notes (Signed)
Pt's mother given purple and black duffle bag containing a computer mouse, laptop charger, and black laptop.

## 2016-11-08 NOTE — Progress Notes (Signed)
Physical Therapy Treatment Patient Details Name: Jennifer [name] MRN: [id number] DOB: [birth year] Today's Date: 11/08/2016    History of Present Illness 31 year old female with PMH of anxiety, depression, HTN, and Substance abuse. Presented to ED on 8/24 after being found unresponsive at home with a suicide note. Intubated 8/24-8/26    PT Comments    Pt was in the company of many visitors when PT arrived, using positive tones with PT conversation.  Her plan is to progress gait and she was able to increase with pulses around 95 at most.  Her plan is for strengthening esp at outpatient, and  To work on endurance to increase her tolerance for distances walked and standing stability.  Acute therapy to continue as pt is available for treatment.  Follow Up Recommendations  Outpatient PT;Supervision for mobility/OOB     Equipment Recommendations  Cane    Recommendations for Other Services OT consult     Precautions / Restrictions Precautions Precautions: Fall (telemetry) Precaution Comments: monitor pulses  Restrictions Weight Bearing Restrictions: No    Mobility  Bed Mobility Overal bed mobility: Modified Independent                Transfers Overall transfer level: Modified independent               General transfer comment: monitored her safety  Ambulation/Gait Ambulation/Gait assistance: Min guard Ambulation Distance (Feet): 325 Feet Assistive device: None Gait Pattern/deviations: Step-through pattern;Narrow base of support;Drifts right/left;Decreased stride length Gait velocity: reduced Gait velocity interpretation: Below normal speed for age/gender General Gait Details: Pt is having some issues with looking to the side, tends to need to catch her drifting of gait and reports feeling unsteady then   Stairs            Wheelchair Mobility    Modified Rankin (Stroke Patients Only)       Balance Overall balance assessment: Needs  assistance Sitting-balance support: Feet supported Sitting balance-Leahy Scale: Good     Standing balance support: Single extremity supported Standing balance-Leahy Scale: Fair                              Cognition Arousal/Alertness: Awake/alert Behavior During Therapy: Flat affect Overall Cognitive Status: Within Functional Limits for tasks assessed                                        Exercises      General Comments General comments (skin integrity, edema, etc.): has a bruised area on R cheek, edema on L thigh      Pertinent Vitals/Pain Pain Assessment: No/denies pain Faces Pain Scale: No hurt    Home Living                      Prior Function            PT Goals (current goals can now be found in the care plan section) Progress towards PT goals: Progressing toward goals    Frequency    Min 3X/week      PT Plan Current plan remains appropriate    Co-evaluation              AM-PAC PT "6 Clicks" Daily Activity  Outcome Measure  Difficulty turning over in bed (including adjusting bedclothes, sheets and blankets)?: None Difficulty moving  from lying on back to sitting on the side of the bed? : None Difficulty sitting down on and standing up from a chair with arms (e.g., wheelchair, bedside commode, etc,.)?: A Little Help needed moving to and from a bed to chair (including a wheelchair)?: A Little Help needed walking in hospital room?: A Little Help needed climbing 3-5 steps with a railing? : A Little 6 Click Score: 20    End of Session Equipment Utilized During Treatment: Gait belt Activity Tolerance: Patient tolerated treatment well Patient left: in bed;with call bell/phone within reach;with family/visitor present;with nursing/sitter in room Nurse Communication: Mobility status PT Visit Diagnosis: Other abnormalities of gait and mobility (R26.89)      Charges:  $Gait Training: 8-22 mins $Therapeutic  Activity: 8-22 mins                    G Codes:        Ivar Drape Nov 11, 2016, 2:13 PM   Samul Dada, PT MS Acute Rehab Dept. Number: [facility] [id number] and [facility] [phone]

## 2016-11-08 NOTE — Progress Notes (Signed)
Report called to [facility], [place]ola rn LOUNA GLAUSER to be D/C'd Rehab per MD order.  Discussed with the patient and all questions fully answered.  VSS, Skin clean, dry and intact without evidence of skin break down, no evidence of skin tears noted. IV catheter discontinued intact. Site without signs and symptoms of complications. Dressing and pressure applied.  An After Visit Summary was printed and given to the patient. Patient received prescription.  D/c education completed with patient/family including follow up instructions, medication list, d/c activities limitations if indicated, with other d/c instructions as indicated by MD - patient able to verbalize understanding, all questions fully answered.   Patient instructed to return to ED, call 911, or call MD for any changes in condition.   Jennifer [name] 11/08/2016 7:52 PM

## 2016-11-08 NOTE — Discharge Summary (Signed)
Physician Discharge Summary  KRISTINIA LEAVY WUJ:[id number] DOB: Sep 27, 1985 DOA: 11/04/2016  PCP: Patient, No Pcp Per  Admit date: 11/04/2016 Discharge date: 11/08/2016  Time spent: 60 minutes  Recommendations for Outpatient Follow-up:  1. Patient be discharged to inpatient psychiatric unit at [facility].   Discharge Diagnoses:  Principal Problem:   Drug overdose, intentional, initial encounter (HCC) Active Problems:   Suicidal ideation   Overdose   MDD (major depressive disorder)   Hypokalemia   Acute respiratory failure with hypoxia (HCC)   Aspiration into respiratory tract   QT prolongation   Acute metabolic encephalopathy   Pneumothorax on right: small apical   Overdose, intentional self-harm, initial encounter [facility])   Discharge Condition: Stable and improved  Diet recommendation: Regular  Filed Weights   11/06/16 0430 11/07/16 0500 11/08/16 0447  Weight: 84.6 kg (186 lb 8.2 oz) 85.8 kg (189 lb 2.5 oz) 87.5 kg (192 lb 14.4 oz)    History of present illness:  Per Dr Molli Knock 31 year old female with PMH of anxiety, depression, HTN, and Substance abuse  Presented to ED on 8/24 after being found unresponsive at home with a suicide note. Per Family patient had been depression lately with the recent death of her father. Unsure of medication patient ingested, however she was prescribed prozac and had a recent increase in dosage. Was given 6 mg Narcan with minimal improvement. Upon arrival to ED was intubated. UDS negative. Head CT negative. PCCM asked to admit.   Hospital Course:  #1 acute respiratory failure with hypoxia secondary to intentional drug overdose Patient had presented to the ED after being found down and unresponsive with a suicide note with presumed overdose and intubated and sedated for airway protection. Patient was admitted to the ICU. Place on vent support. Chest x-ray done showed a small apical pneumothorax. Alcohol level was less than 5. Salicylate level is  less than 7. UDS was negative. Patient was placed empirically on IV Unasyn for possible aspiration event. Patient was monitored. Patient was subsequently weaned off the ventilator and extubated 11/06/2016. Patient tolerated procedure well without any complications. Patient improved clinically was transitioned from IV antibiotics to oral Augmentin and transferred to the telemetry floor. Patient improved clinically and be discharged in stable and improved condition.  #2 suicide ideation/drug overdose/major depressive disorder Patient had presented with a drug overdose with suicide note. Patient also noted to have a history of major depressive disorder. Patient was intubated for airway protection. After patient was extubated psychiatric consultation was obtained and patient was seen in consultation by Dr. Elsie Saas on 11/07/2016. It was noted per psychiatry that patient had been treated in the past with Wellbutrin which causes an increased heart rate, Celexa made her sedated, Prozac was not working. It was noted that patient had been treated before with effexor and viibryd. Patient was noted per psychiatry to overdose on 32 tablets of baclofen.  It was recommended the patient be placed on Cymbalta. Patient had a Recruitment consultant while in the hospital. Patient was willing to participate in the inpatient psychiatric program. Patient remained stable medically and was cleared for discharge to inpatient psychiatry hospital for further evaluation and management.  #3 aspiration event/aspiration pneumonia It was thought patient might have had an aspiration event on admission as noted on chest x-ray and patient was placed empirically on IV Unasyn. Patient was subsequently transitioned to oral Augmentin for 3 more days to complete a course of antibiotic treatment. Outpatient follow-up.  #4 hypokalemia Repleted.  #5 small right  apical pneumothorax Patient was noted to have a small right apical pneumothorax on  admission. Repeat chest x-ray done showed resolution of apical pneumothorax by day of discharge.  #6 acute metabolic encephalopathy Secondary to problems #1 and 2.  #7 QT prolongation Patient was noted to have a QT prolongation on admission which was felt to be secondary to drug overdose. Repeat EKG done on day of discharge showed resolution of QT prolongation.   Procedures:  Chest x-ray 11/04/2016--- small right apical pneumothorax  CT head 11/04/2016--- no acute abnormalities  ETT 11/04/2016>>> 11/06/2016  Consultations:  PCCM admission  Psychiatry: Dr. Elsie Saas 11/07/2016  Discharge Exam: Vitals:   11/08/16 0447 11/08/16 1307  BP: 122/77 122/78  Pulse: 72 84  Resp: 16 16  Temp: 98.4 F (36.9 C) 98.2 F (36.8 C)  SpO2: 98% 99%    General: NAD Cardiovascular: RRR Respiratory: CTAB  Discharge Instructions   Discharge Instructions    Diet general    Complete by:  As directed    Increase activity slowly    Complete by:  As directed      Current Discharge Medication List    START taking these medications   Details  amoxicillin-clavulanate (AUGMENTIN) 875-125 MG tablet Take 1 tablet by mouth every 12 (twelve) hours. Qty: 6 tablet, Refills: 0    DULoxetine (CYMBALTA) 30 MG capsule Take 1 capsule (30 mg total) by mouth daily. Qty: 30 capsule, Refills: 0    ondansetron (ZOFRAN) 4 MG/5ML solution Take 5 mLs (4 mg total) by mouth every 8 (eight) hours as needed for nausea or vomiting. Qty: 50 mL, Refills: 0       No Known Allergies Follow-up Information    MD AT PSYCHIATRY Follow up.            The results of significant diagnostics from this hospitalization (including imaging, microbiology, ancillary and laboratory) are listed below for reference.    Significant Diagnostic Studies: Dg Chest 1 View  Result Date: 11/04/2016 CLINICAL DATA:  Patient found unconscious today.  Substance abuse. EXAM: CHEST 1 VIEW COMPARISON:  None. FINDINGS:  Endotracheal tube is in place with the tip just below the clavicular heads in good position. OG tube courses into the stomach with its tip below the inferior margin of film. Small focus of subsegmental atelectasis in the right upper lung zone is identified. Small right apical pneumothorax is identified. No left pneumothorax. No pleural fluid. No acute bony abnormality. IMPRESSION: ETT and NG tube projecting good position. Small right apical pneumothorax. Subsegmental atelectasis right upper lobe lung zone. Critical Value/emergent results were called by telephone at the time of interpretation on 11/04/2016 at 11:34 am to Dr. Alona Bene , who verbally acknowledged these results. Electronically Signed   By: Drusilla Kanner M.D.   On: 11/04/2016 11:35   Ct Head Wo Contrast  Result Date: 11/04/2016 CLINICAL DATA:  Found on floor, unresponsive. EXAM: CT HEAD WITHOUT CONTRAST TECHNIQUE: Contiguous axial images were obtained from the base of the skull through the vertex without intravenous contrast. COMPARISON:  None. FINDINGS: Brain: No acute intracranial abnormality. Specifically, no hemorrhage, hydrocephalus, mass lesion, acute infarction, or significant intracranial injury. Vascular: No hyperdense vessel or unexpected calcification. Skull: No acute calvarial abnormality. Sinuses/Orbits: Mucosal thickening in the paranasal sinuses. Mastoid air cells are clear. Orbital soft tissues unremarkable. Other: None IMPRESSION: No acute intracranial abnormality. Chronic sinusitis. Electronically Signed   By: Charlett Nose M.D.   On: 11/04/2016 12:04   Dg Chest [facility] 1 View  Result  Date: 11/06/2016 CLINICAL DATA:  Acute respiratory failure. On ventilator. Drug overdose. EXAM: PORTABLE CHEST 1 VIEW COMPARISON:  11/05/2016 FINDINGS: Endotracheal tube and nasogastric tube remain in appropriate position. Mild patchy airspace disease in right lung base shows no significant change. Left lung remains clear. No evidence of  pneumothorax or pleural effusion. IMPRESSION: Mild right basilar infiltrate, without significant change. Electronically Signed   By: Myles Rosenthal M.D.   On: 11/06/2016 07:15   Dg Chest Port 1 View  Result Date: 11/05/2016 CLINICAL DATA:  Check endotracheal tube placement EXAM: PORTABLE CHEST 1 VIEW COMPARISON:  11/04/2016 FINDINGS: Endotracheal tube and nasogastric catheter are noted in satisfactory position. The lungs are well aerated bilaterally. Very minimal right basilar atelectasis is seen. Cardiac shadow is within normal limits. No bony abnormality is seen. IMPRESSION: Minimal new right basilar atelectasis. Electronically Signed   By: Alcide Clever M.D.   On: 11/05/2016 14:21   Dg Chest Port 1 View  Result Date: 11/04/2016 CLINICAL DATA:  Pneumothorax EXAM: PORTABLE CHEST 1 VIEW COMPARISON:  11/04/2016 at 1443 hours FINDINGS: Lungs are clear. No pleural effusion. Tiny right apical pneumothorax, unchanged. Endotracheal tube terminates 4.5 cm above the carina. Enteric tube courses into the gastric cardia. The heart is normal in size. IMPRESSION: Tiny apical pneumothorax, unchanged. Endotracheal tube terminates 4.5 cm above the carina. Electronically Signed   By: Charline Bills M.D.   On: 11/04/2016 21:46   Dg Chest Port 1 View  Result Date: 11/04/2016 CLINICAL DATA:  Follow up pneumothorax.  Found unresponsive today. EXAM: PORTABLE CHEST 1 VIEW COMPARISON:  Earlier same date. FINDINGS: 1443 hours. The endotracheal and nasogastric tubes appear unchanged. Small right apical pneumothorax is stable to minimally larger. There is no tension component. There is interval improved aeration of the right upper lobe. The lungs are clear. The heart size and mediastinal contours are stable. No acute osseous findings are seen. IMPRESSION: Small right apical pneumothorax is stable to slightly larger from the earlier examination, but there is no mediastinal shift or tension component. No other significant findings.  Electronically Signed   By: Carey Bullocks M.D.   On: 11/04/2016 15:01    Microbiology: Recent Results (from the past 240 hour(s))  Blood culture (routine x 2)     Status: None (Preliminary result)   Collection Time: 11/04/16 11:01 AM  Result Value Ref Range Status   Specimen Description BLOOD LEFT ANTECUBITAL  Final   Special Requests   Final    BOTTLES DRAWN AEROBIC AND ANAEROBIC Blood Culture adequate volume   Culture NO GROWTH 4 DAYS  Final   Report Status PENDING  Incomplete  Blood culture (routine x 2)     Status: None (Preliminary result)   Collection Time: 11/04/16 11:09 AM  Result Value Ref Range Status   Specimen Description BLOOD RIGHT ANTECUBITAL  Final   Special Requests   Final    BOTTLES DRAWN AEROBIC AND ANAEROBIC Blood Culture adequate volume   Culture NO GROWTH 4 DAYS  Final   Report Status PENDING  Incomplete  MRSA PCR Screening     Status: None   Collection Time: 11/04/16  3:31 PM  Result Value Ref Range Status   MRSA by PCR NEGATIVE NEGATIVE Final    Comment:        The GeneXpert MRSA Assay (FDA approved for NASAL specimens only), is one component of a comprehensive MRSA colonization surveillance program. It is not intended to diagnose MRSA infection nor to guide or monitor treatment for MRSA  infections.      Labs: Basic Metabolic Panel:  Recent Labs Lab 11/04/16 1520 11/05/16 0326 11/06/16 0256 11/07/16 0217 11/08/16 0257  NA 143 143 147* 142 141  K 4.2 3.9 3.3* 3.2* 3.4*  CL 108 111 114* 111 108  CO2 25 22 27 26 26   GLUCOSE 109* 125* 98 99 86  BUN 8 9 10  5* 6  CREATININE 0.81 0.80 0.71 0.50 0.55  CALCIUM 9.5 8.3* 8.0* 8.0* 8.2*  MG  --  1.8 2.0 1.9 1.9  PHOS  --  4.1 1.8* 3.8 4.1   Liver Function Tests:  Recent Labs Lab 11/04/16 1232  AST 62*  ALT 28  ALKPHOS 42  BILITOT 1.1  PROT 6.5  ALBUMIN 3.8    Recent Labs Lab 11/04/16 1232  LIPASE 22   No results for input(s): AMMONIA in the last 168 hours. CBC:  Recent  Labs Lab 11/04/16 1101 11/04/16 1120 11/05/16 0326 11/06/16 0256 11/07/16 0217 11/08/16 0257  WBC 13.5*  --  19.9* 13.6* 9.1 6.7  HGB 13.8 14.3  14.3 14.0 11.6* 10.3* 10.4*  HCT 40.4 42.0  42.0 41.6 35.4* 31.3* 31.3*  MCV 88.8  --  89.1 92.9 92.6 89.2  PLT 358  --  308 243 206 232   Cardiac Enzymes:  Recent Labs Lab 11/04/16 1520 11/04/16 1822 11/05/16 0326 11/06/16 0256  CKTOTAL  --  7,143* 4,008* 2,671*  TROPONINI 0.04*  --   --   --    BNP: BNP (last 3 results) No results for input(s): BNP in the last 8760 hours.  ProBNP (last 3 results) No results for input(s): PROBNP in the last 8760 hours.  CBG:  Recent Labs Lab 11/06/16 1625 11/06/16 2013 11/06/16 2351 11/07/16 0340 11/07/16 0817  GLUCAP 93 92 86 85 75       Signed:  THOMPSON,DANIEL MD.  [facility] 11/08/2016, 7:14 PM

## 2016-11-09 ENCOUNTER — Inpatient Hospital Stay: Payer: No Typology Code available for payment source

## 2016-11-09 ENCOUNTER — Inpatient Hospital Stay
Admission: AD | Admit: 2016-11-09 | Discharge: 2016-11-11 | DRG: 885 | Disposition: A | Discharge status: Home / Self Care | Payer: No Typology Code available for payment source | Source: Intra-hospital | Attending: Psychiatry | Admitting: Psychiatry

## 2016-11-09 DIAGNOSIS — Z8249 Family history of ischemic heart disease and other diseases of the circulatory system: Secondary | ICD-10-CM

## 2016-11-09 DIAGNOSIS — I4581 Long QT syndrome: Secondary | ICD-10-CM | POA: Diagnosis present

## 2016-11-09 DIAGNOSIS — R45851 Suicidal ideations: Secondary | ICD-10-CM | POA: Diagnosis present

## 2016-11-09 DIAGNOSIS — E876 Hypokalemia: Secondary | ICD-10-CM | POA: Diagnosis present

## 2016-11-09 DIAGNOSIS — M6282 Rhabdomyolysis: Secondary | ICD-10-CM | POA: Diagnosis present

## 2016-11-09 DIAGNOSIS — M1990 Unspecified osteoarthritis, unspecified site: Secondary | ICD-10-CM | POA: Diagnosis present

## 2016-11-09 DIAGNOSIS — J9601 Acute respiratory failure with hypoxia: Secondary | ICD-10-CM | POA: Diagnosis present

## 2016-11-09 DIAGNOSIS — F332 Major depressive disorder, recurrent severe without psychotic features: Principal | ICD-10-CM | POA: Diagnosis present

## 2016-11-09 DIAGNOSIS — J690 Pneumonitis due to inhalation of food and vomit: Secondary | ICD-10-CM | POA: Diagnosis present

## 2016-11-09 DIAGNOSIS — R52 Pain, unspecified: Secondary | ICD-10-CM

## 2016-11-09 DIAGNOSIS — F419 Anxiety disorder, unspecified: Secondary | ICD-10-CM | POA: Diagnosis present

## 2016-11-09 DIAGNOSIS — Z818 Family history of other mental and behavioral disorders: Secondary | ICD-10-CM | POA: Diagnosis not present

## 2016-11-09 DIAGNOSIS — G9341 Metabolic encephalopathy: Secondary | ICD-10-CM | POA: Diagnosis present

## 2016-11-09 DIAGNOSIS — T50901A Poisoning by unspecified drugs, medicaments and biological substances, accidental (unintentional), initial encounter: Secondary | ICD-10-CM | POA: Diagnosis present

## 2016-11-09 DIAGNOSIS — I10 Essential (primary) hypertension: Secondary | ICD-10-CM | POA: Diagnosis present

## 2016-11-09 DIAGNOSIS — Z806 Family history of leukemia: Secondary | ICD-10-CM | POA: Diagnosis not present

## 2016-11-09 DIAGNOSIS — G4700 Insomnia, unspecified: Secondary | ICD-10-CM | POA: Diagnosis present

## 2016-11-09 DIAGNOSIS — T17908A Unspecified foreign body in respiratory tract, part unspecified causing other injury, initial encounter: Secondary | ICD-10-CM | POA: Diagnosis present

## 2016-11-09 LAB — CULTURE, BLOOD (ROUTINE X 2)
CULTURE: NO GROWTH
Culture: NO GROWTH
Special Requests: ADEQUATE
Special Requests: ADEQUATE

## 2016-11-09 LAB — BASIC METABOLIC PANEL
ANION GAP: 9 (ref 5–15)
BUN: 11 mg/dL (ref 6–20)
CHLORIDE: 101 mmol/L (ref 101–111)
CO2: 27 mmol/L (ref 22–32)
Calcium: 9.3 mg/dL (ref 8.9–10.3)
Creatinine, Ser: 0.65 mg/dL (ref 0.44–1.00)
GFR calc non Af Amer: >60 mL/min (ref >60)
GLUCOSE: 92 mg/dL (ref 65–99)
POTASSIUM: 4.4 mmol/L (ref 3.5–5.1)
Sodium: 137 mmol/L (ref 135–145)

## 2016-11-09 LAB — MAGNESIUM: Magnesium: 2.1 mg/dL (ref 1.7–2.4)

## 2016-11-09 LAB — CK: Total CK: 2401 U/L — ABNORMAL HIGH (ref 38–234)

## 2016-11-09 LAB — CBC
HEMATOCRIT: 36.3 % (ref 35.0–47.0)
Hemoglobin: 12.6 g/dL (ref 12.0–16.0)
MCH: 31.3 pg (ref 26.0–34.0)
MCHC: 34.8 g/dL (ref 32.0–36.0)
MCV: 89.9 fL (ref 80.0–100.0)
PLATELETS: 334 10*3/uL (ref 150–440)
RBC: 4.04 MIL/uL (ref 3.80–5.20)
RDW: 13.3 % (ref 11.5–14.5)
WBC: 5.7 10*3/uL (ref 3.6–11.0)

## 2016-11-09 LAB — SEDIMENTATION RATE: SED RATE: 64 mm/h — AB (ref 0–20)

## 2016-11-09 MED ORDER — AMOXICILLIN-POT CLAVULANATE 875-125 MG PO TABS
1.0000 | ORAL_TABLET | Freq: Two times a day (BID) | ORAL | Status: AC
  Administered 2016-11-09 – 2016-11-11 (×5): 1 via ORAL
  Filled 2016-11-09 (×7): qty 1

## 2016-11-09 MED ORDER — MAGNESIUM HYDROXIDE 400 MG/5ML PO SUSP: 30.0000 mL | Freq: Every day | ORAL | Status: DC | PRN

## 2016-11-09 MED ORDER — ACETAMINOPHEN 325 MG PO TABS
650.0000 mg | ORAL_TABLET | Freq: Four times a day (QID) | ORAL | Status: DC | PRN
  Administered 2016-11-09 (×2): 650 mg via ORAL
  Filled 2016-11-09 (×2): qty 2

## 2016-11-09 MED ORDER — ALUM & MAG HYDROXIDE-SIMETH 200-200-20 MG/5ML PO SUSP: 30.0000 mL | ORAL | Status: DC | PRN

## 2016-11-09 MED ORDER — DOXYCYCLINE HYCLATE 100 MG PO TABS
100.0000 mg | ORAL_TABLET | Freq: Two times a day (BID) | ORAL | Status: DC
  Administered 2016-11-09 – 2016-11-11 (×4): 100 mg via ORAL
  Filled 2016-11-09 (×6): qty 1

## 2016-11-09 MED ORDER — DULOXETINE HCL 30 MG PO CPEP
30.0000 mg | ORAL_CAPSULE | Freq: Every day | ORAL | Status: DC
  Administered 2016-11-09 – 2016-11-10 (×2): 30 mg via ORAL
  Filled 2016-11-09 (×2): qty 1

## 2016-11-09 MED ORDER — FLUVOXAMINE MALEATE 50 MG PO TABS
50.0000 mg | ORAL_TABLET | Freq: Every day | ORAL | Status: DC
  Administered 2016-11-09: 50 mg via ORAL
  Filled 2016-11-09: qty 1

## 2016-11-09 MED ORDER — ZOLPIDEM TARTRATE 5 MG PO TABS
5.0000 mg | ORAL_TABLET | Freq: Every evening | ORAL | Status: DC | PRN
  Administered 2016-11-10: 5 mg via ORAL
  Filled 2016-11-09: qty 1

## 2016-11-09 NOTE — [facility] Suicide Risk Assessment (Signed)
[facility]   Nursing information obtained from:    Demographic factors:    Current Mental Status:    Loss Factors:    Historical Factors:    Risk Reduction Factors:     Total Time spent with patient: 1 hour Principal Problem: Major depressive disorder, recurrent severe without psychotic features (HCC) Diagnosis:   Patient Active Problem List   Diagnosis Date Noted  . Drug overdose, intentional, initial encounter (HCC) [T50.902A] 11/08/2016  . Suicidal ideation [R45.851] 11/08/2016  . Major depressive disorder, recurrent severe without psychotic features (HCC) [F33.2] 11/08/2016  . Hypokalemia [E87.6] 11/08/2016  . Acute respiratory failure with hypoxia (HCC) [J96.01] 11/08/2016  . Aspiration into respiratory tract [T17.908A] 11/08/2016  . QT prolongation [R94.31] 11/08/2016  . Acute metabolic encephalopathy [G93.41] 11/08/2016  . Pneumothorax on right: small apical [J93.9] 11/08/2016  . Overdose, intentional self-harm, initial encounter (HCC) [T50.902A]   . Overdose [T50.901A] 11/04/2016   Subjective Data: overdose.  Continued Clinical Symptoms:  Alcohol Use Disorder Identification Test Final Score (AUDIT): 1 The "Alcohol Use Disorders Identification Test", Guidelines for Use in Primary Care, Second Edition.  World Science writerHealth Organization [facility](WHO). Score between 0-7:  no or low risk or alcohol related problems. Score between 8-15:  moderate risk of alcohol related problems. Score between 16-19:  high risk of alcohol related problems. Score 20 or above:  warrants further diagnostic evaluation for alcohol dependence and treatment.   CLINICAL FACTORS:   Depression:   Impulsivity Insomnia Obsessive-Compulsive Disorder Medical Diagnoses and Treatments/Surgeries   Musculoskeletal: Strength & Muscle Tone: decreased Gait & Station: unsteady Patient leans: N/A  Psychiatric Specialty Exam: Physical Exam  Nursing note and vitals reviewed. Psychiatric: Her  speech is normal. Thought content normal. Her mood appears anxious. Her affect is labile. She is withdrawn. Cognition and memory are normal. She expresses impulsivity. She exhibits a depressed mood.    Review of Systems  Cardiovascular: Positive for chest pain and leg swelling.  Musculoskeletal: Positive for joint pain.  Neurological: Positive for weakness.  Psychiatric/Behavioral: Positive for depression and suicidal ideas. The patient is nervous/anxious and has insomnia.   All other systems reviewed and are negative.   Blood pressure (!) 130/94, pulse (!) 111, temperature 98.5 F (36.9 C), resp. rate 18, height 5\' 11"  (1.803 m), weight 83.9 kg (185 lb), SpO2 100 %.Body mass index is 25.8 kg/m.  General Appearance: Casual  Eye Contact:  Good  Speech:  Clear and Coherent  Volume:  Decreased  Mood:  Anxious and Depressed  Affect:  Tearful  Thought Process:  Goal Directed and Descriptions of Associations: Intact  Orientation:  Full (Time, Place, and Person)  Thought Content:  WDL  Suicidal Thoughts:  No  Homicidal Thoughts:  No  Memory:  Immediate;   Fair Recent;   Fair Remote;   Fair  Judgement:  Impaired  Insight:  Present  Psychomotor Activity:  Psychomotor Retardation  Concentration:  Concentration: Fair and Attention Span: Fair  Recall:  [employer]Fair  Fund of Knowledge:  Fair  Language:  Fair  Akathisia:  No  Handed:  Right  AIMS (if indicated):     Assets:  Communication Skills Desire for Improvement Housing Intimacy Physical Health Resilience Social Support  ADL's:  Intact  Cognition:  WNL  Sleep:  Number of Hours: 3      COGNITIVE FEATURES THAT CONTRIBUTE TO RISK:  None    SUICIDE RISK:   Moderate:  Frequent suicidal ideation with limited intensity, and duration, some specificity in  terms of plans, no associated intent, good self-control, limited dysphoria/symptomatology, some risk factors present, and identifiable protective factors, including available and  accessible social support.  PLAN OF CARE: hospital admission. Medication management, discharge planning.  Ms. Klinker is a 31 year old female with a history of depression transferred from [facility] medical floor where she was hospitalized after suicide attempt by overdose on baclofen in the context of major loss.  1. Suicidal ideation. The patient is able to contract for safety in the hospital.   2. Mood. She was started on Cymbalta at [facility].  3. Insomnia. Slept 3 hours only. We will offer Trazodone.  4. Aspiration pneumonia. She is on Augmentin.  5. Swelling, numbness and pain of the legs. Spoke with Dr. Allena Katz about consultation.   6. Metabolic syndrome monitoring. Lipid panel, TSH and HgbA1C are pending.  7. EKG. There was QTc prolongation caused by overdose that has resolved.  8. Disposition. She will be discharged with family. She needs follow up appointment in [place], [place] where she lives.     I certify that inpatient services furnished can reasonably be expected to improve the patient's condition.   Kristine Linea, MD 11/09/2016, 11:12 AM

## 2016-11-09 NOTE — Tx Team (Signed)
Interdisciplinary Treatment and Diagnostic Plan Update  11/09/2016 Time of Session: 10:30am [name] MRN: [id number]  Principal Diagnosis: Major depressive disorder, recurrent severe without psychotic features [facility])  Secondary Diagnoses: Principal Problem:   Major depressive disorder, recurrent severe without psychotic features (HCC) Active Problems:   Overdose   Aspiration into respiratory tract   Current Medications:  Current Facility-Administered Medications  Medication Dose Route Frequency Provider Last Rate Last Dose  . acetaminophen (TYLENOL) tablet 650 mg  650 mg Oral Q6H PRN Jimmy Footman, MD   650 mg at 11/09/16 0823  . alum & mag hydroxide-simeth (MAALOX/MYLANTA) 200-200-20 MG/5ML suspension 30 mL  30 mL Oral Q4H PRN Jimmy Footman, MD      . amoxicillin-clavulanate (AUGMENTIN) 875-125 MG per tablet 1 tablet  1 tablet Oral Q12H Jimmy Footman, MD   1 tablet at 11/09/16 7829  . doxycycline (VIBRA-TABS) tablet 100 mg  100 mg Oral Q12H Wieting, Richard, MD      . DULoxetine (CYMBALTA) DR capsule 30 mg  30 mg Oral Daily Jimmy Footman, MD   30 mg at 11/09/16 5621  . fluvoxaMINE (LUVOX) tablet 50 mg  50 mg Oral QHS Pucilowska, Jolanta B, MD      . magnesium hydroxide (MILK OF MAGNESIA) suspension 30 mL  30 mL Oral Daily PRN Jimmy Footman, MD      . zolpidem (AMBIEN) tablet 5 mg  5 mg Oral QHS PRN Pucilowska, Jolanta B, MD       PTA Medications: Prescriptions Prior to Admission  Medication Sig Dispense Refill Last Dose  . amoxicillin-clavulanate (AUGMENTIN) 875-125 MG tablet Take 1 tablet by mouth every 12 (twelve) hours. 6 tablet 0 11/08/2016 at 1300  . DULoxetine (CYMBALTA) 30 MG capsule Take 1 capsule (30 mg total) by mouth daily. 30 capsule 0 11/08/2016 at 1300  . ondansetron (ZOFRAN) 4 MG/5ML solution Take 5 mLs (4 mg total) by mouth every 8 (eight) hours as needed for nausea or vomiting. 50 mL 0 11/08/2016 at  1300    Patient Stressors: Loss of father  Patient Strengths: Average or above average intelligence Capable of independent living Communication skills Supportive family/friends  Treatment Modalities: Medication Management, Group therapy, Case management,  1 to 1 session with clinician, Psychoeducation, Recreational therapy.   Physician Treatment Plan for Primary Diagnosis: Major depressive disorder, recurrent severe without psychotic features (HCC) Long Term Goal(s): Improvement in symptoms so as ready for discharge NA   Short Term Goals: Ability to identify changes in lifestyle to reduce recurrence of condition will improve Ability to verbalize feelings will improve Ability to disclose and discuss suicidal ideas Ability to demonstrate self-control will improve Ability to identify and develop effective coping behaviors will improve Ability to maintain clinical measurements within normal limits will improve Compliance with prescribed medications will improve Ability to identify triggers associated with substance abuse/mental health issues will improve NA  Medication Management: Evaluate patient's response, side effects, and tolerance of medication regimen.  Therapeutic Interventions: 1 to 1 sessions, Unit Group sessions and Medication administration.  Evaluation of Outcomes: Progressing  Physician Treatment Plan for Secondary Diagnosis: Principal Problem:   Major depressive disorder, recurrent severe without psychotic features (HCC) Active Problems:   Overdose   Aspiration into respiratory tract  Long Term Goal(s): Improvement in symptoms so as ready for discharge NA   Short Term Goals: Ability to identify changes in lifestyle to reduce recurrence of condition will improve Ability to verbalize feelings will improve Ability to disclose and discuss suicidal ideas Ability to  demonstrate self-control will improve Ability to identify and develop effective coping behaviors  will improve Ability to maintain clinical measurements within normal limits will improve Compliance with prescribed medications will improve Ability to identify triggers associated with substance abuse/mental health issues will improve NA     Medication Management: Evaluate patient's response, side effects, and tolerance of medication regimen.  Therapeutic Interventions: 1 to 1 sessions, Unit Group sessions and Medication administration.  Evaluation of Outcomes: Progressing   RN Treatment Plan for Primary Diagnosis: Major depressive disorder, recurrent severe without psychotic features (HCC) Long Term Goal(s): Knowledge of disease and therapeutic regimen to maintain health will improve  Short Term Goals: Ability to disclose and discuss suicidal ideas, Ability to identify and develop effective coping behaviors will improve and Compliance with prescribed medications will improve  Medication Management: RN will administer medications as ordered by provider, will assess and evaluate patient's response and provide education to patient for prescribed medication. RN will report any adverse and/or side effects to prescribing provider.  Therapeutic Interventions: 1 on 1 counseling sessions, Psychoeducation, Medication administration, Evaluate responses to treatment, Monitor vital signs and CBGs as ordered, Perform/monitor CIWA, COWS, AIMS and Fall Risk screenings as ordered, Perform wound care treatments as ordered.  Evaluation of Outcomes: Progressing   LCSW Treatment Plan for Primary Diagnosis: Major depressive disorder, recurrent severe without psychotic features (HCC) Long Term Goal(s): Safe transition to appropriate next level of care at discharge, Engage patient in therapeutic group addressing interpersonal concerns.  Short Term Goals: Engage patient in aftercare planning with referrals and resources, Increase ability to appropriately verbalize feelings, Increase emotional regulation and  Increase skills for wellness and recovery  Therapeutic Interventions: Assess for all discharge needs, 1 to 1 time with Social worker, Explore available resources and support systems, Assess for adequacy in community support network, Educate family and significant other(s) on suicide prevention, Complete Psychosocial Assessment, Interpersonal group therapy.  Evaluation of Outcomes: Progressing   Progress in Treatment: Attending groups: Yes. Participating in groups: Yes. Taking medication as prescribed: Yes. Toleration medication: Yes. Family/Significant other contact made: No, will contact:  mother Patient understands diagnosis: Yes. Discussing patient identified problems/goals with staff: Yes. Medical problems stabilized or resolved: No. Denies suicidal/homicidal ideation: Yes. Issues/concerns per patient self-inventory: Yes. Other:    New problem(s) identified: Yes, Describe:  numbness in legs other physical pain in legs and chest.  New Short Term/Long Term Goal(s):  Discharge Plan or Barriers: Home to [place], follow up with outpatient provider there.  Reason for Continuation of Hospitalization: Depression Medical Issues Medication stabilization Other; describe Recent major overdose  Estimated Length of Stay: 3-5 days  Attendees: Patient:Jennifer [name] 11/09/2016 5:32 PM  Physician: Kristine Linea 11/09/2016 5:32 PM  Nursing: Hulan Amato, RN 11/09/2016 5:32 PM  RN Care Manager: 11/09/2016 5:32 PM  Social Worker: Jake Shark, LCSW 11/09/2016 5:32 PM  Recreational Therapist: Hershal Coria, LRT 11/09/2016 5:32 PM  Other:  11/09/2016 5:32 PM  Other:  11/09/2016 5:32 PM  Other: 11/09/2016 5:32 PM    Scribe for Treatment Team: Glennon Mac, LCSW 11/09/2016 5:32 PM

## 2016-11-09 NOTE — Plan of Care (Signed)
Problem: Activity: Goal: Interest or engagement in activities will improve Outcome: Progressing Pt will engage in at least one activity with peers this shift.

## 2016-11-09 NOTE — Progress Notes (Signed)
Recreation Therapy Notes  At approximately 2:00 pm, LRT attempted assessment. Patient was resting her legs. LRT will attempt assessment tomorrow.  Jacquelynn Cree, LRT/CTRS 11/09/2016 3:52 PM

## 2016-11-09 NOTE — Progress Notes (Signed)
Pt is in a pleasant mood. Pt is still a little anxious. Pt did go into dayroom and interact with peers. Pt states she is feeling much better, the staff is so helpful and nice. Pt is compliant with meds, no adverse affect noted. Denies SI, HI or A/V hallucinations. Pt denies depression at this time. Pt says she just needed someone to talk to, pt encourage at anytime she needs to talk staff is available. Pt did have left knee pain 5/10, pt given Tylenol 650 mg tab po. Pt tolerable pain level is 2/10. Pt reassessed and pain level is now 2/10. 15 min safety checks continues.

## 2016-11-09 NOTE — Tx Team (Addendum)
Initial Treatment Plan 11/09/2016 2:59 AM Jennifer [name] UJW:[id number]    PATIENT STRESSORS: Loss of father   PATIENT STRENGTHS: Average or above average intelligence Capable of independent living Communication skills Supportive family/friends   PATIENT IDENTIFIED PROBLEMS: Depression   Grieving the loss of father  Anxiety  Guilt  Coping skills             DISCHARGE CRITERIA:  Ability to meet basic life and health needs Adequate post-discharge living arrangements Improved stabilization in mood, thinking, and/or behavior Reduction of life-threatening or endangering symptoms to within safe limits Verbal commitment to aftercare and medication compliance  PRELIMINARY DISCHARGE PLAN: Return to previous living arrangement Return to previous work or school arrangements  PATIENT/FAMILY INVOLVEMENT: This treatment plan has been presented to and reviewed with the patient, Jennifer [name].  The patient and family have been given the opportunity to ask questions and make suggestions.  Chancy HurterNichole  Ledger Heindl, RN 11/09/2016, 2:59 AM

## 2016-11-09 NOTE — Progress Notes (Signed)
Recreation Therapy Notes  Date: 08.29.18 Time: 1:00 pm Location: Craft Room  Group Topic: Self-esteem  Goal Area(s) Addresses:  Patient will identify positive traits about self. Patient will verbalize benefit of having a healthy self-esteem.  Behavioral Response: Did not attend  Intervention: I Am   Activity: Patients were given worksheet with the letter I on it and were instructed to write as many positive traits about themselves inside the letter.  Education: LRT educated patients on ways to increase their self-esteem.  Education Outcome: Patient did not attend group.  Clinical Observations/Feedback: Patient did not attend group.  [name],Candy Ziegler M, LRT/CTRS 11/09/2016 1:45 PM

## 2016-11-09 NOTE — Progress Notes (Signed)
D:  Patient  Stated " I want to surround myself  With positive  People . Affect  Cheerful  On approach .  Patient stated slept good last night .Stated appetite poor and energy level  Is normal. Stated concentration is good . Stated on Depression scale 3, hopeless 0 and anxiety 7 .( low 0-10 high) Denies suicidal  homicidal ideations  .  No auditory hallucinations  No pain concerns . Appropriate ADL'S. Interacting with peers and staff.  A: Encourage patient participation with unit programming . Instruction  Given on  Medication , verbalize understanding. R: Voice no other concerns. Staff continue to monitor

## 2016-11-09 NOTE — H&P (Signed)
Psychiatric Admission Assessment Adult  [name] Identification: Jennifer [name] MRN:  [id number] Date of Evaluation:  11/09/2016 Chief Complaint:  Depression Principal Diagnosis: Major depressive disorder, recurrent severe without psychotic features ([place]) Diagnosis:   [name] Active Problem List   Diagnosis Date Noted  . Drug overdose, intentional, initial encounter ([place]) [T50.902A] 11/08/2016  . Suicidal ideation [R45.851] 11/08/2016  . Major depressive disorder, recurrent severe without psychotic features ([place]) [F33.2] 11/08/2016  . Hypokalemia [E87.6] 11/08/2016  . Acute respiratory failure with hypoxia ([place]) [J96.01] 11/08/2016  . Aspiration into respiratory tract [T17.908A] 11/08/2016  . QT prolongation [R94.31] 11/08/2016  . Acute metabolic encephalopathy [U54.27] 11/08/2016  . Pneumothorax on right: small apical [J93.9] 11/08/2016  . Overdose, intentional self-harm, initial encounter ([place]) [T50.902A]   . Overdose [T50.901A] 11/04/2016   History of Present Illness:   Identifying data. Jennifer [name] is a 31 year old female with a history of depression.  Chief complaint. "I feel stupid."  History of present illness. Information was obtained from Jennifer [name] and Jennifer chart. Jennifer [name] was transferred to [facility] from [place] floor where she was briefly hospitalized after suicide attempt by baclofen overdose. Jennifer [name] reportedly took 32 pills of baclofen and was found unconscious on Jennifer floor of Jennifer bathroom by her family. Per chart, suicidal note was found as well. Jennifer [name] denies writing a suicide note but admits that she had her father's ashes next to her at Jennifer time of overdose. Jennifer [name] experienced multiple losses recently including sudden death of her father. He died of a heart attack within 1 hour and Jennifer [name] did not make it to Jennifer hospital in time. Reportedly Jennifer father was asking for her. She felt very guilty and decided to  overdose. Jennifer [name] reports many symptoms of depression with poor sleep, decreased appetite, anhedonia, feeling of guilt and hopelessness worthlessness, poor energy and concentration, social isolation, crying spells, high anxiety, and suicidal ideation that culminated in suicide attempt. She denies psychotic symptoms or symptoms suggestive of bipolar mania. She denies history of mania but was diagnosed with bipolar disorder due to frequent bouts of severe depression. She reports reports anxiety especially social anxiety and OCD type symptoms. There is no alcohol or drug use.  Past psychiatric history. She has never been hospitalized or attempted suicide. She has been tried on several antidepressants including Wellbutrin, Prozac, Effexor and Viibryd. As she did well on Prozac and Viibryd. She reports elevated blood pressure from other antidepressants. She has not been taking any medications recently but already made arrangements for psychotherapy in [place], [place] where she lives.  Family psychiatric history. Multiple family members with depression and anxiety. Her aunt and her grandmother both attempted suicide.  Social history. She has been living in [place] for Jennifer past 10 years taking care of her father who had leukemia. She is in a supportive relationship. She works as a Designer, multimedia at [employer]. Her mother and sister live in [place]. She was visiting with them at Jennifer time of overdose.  Total Time spent with [name]: 1 hour  Is Jennifer [name] at risk to self? Yes.    Has Jennifer [name] been a risk to self in Jennifer past 6 months? No.  Has Jennifer [name] been a risk to self within Jennifer distant past? No.  Is Jennifer [name] a risk to others? No.  Has Jennifer [name] been a risk to others in Jennifer past 6 months? No.  Has Jennifer [name] been a risk to others within  Jennifer distant past? No.   Prior Inpatient Therapy:   Prior Outpatient Therapy:    Alcohol Screening: [name] refused Alcohol Screening Tool: Yes 1. How  often do you have a drink containing alcohol?: Monthly or less 2. How many drinks containing alcohol do you have on a typical day when you are drinking?: 1 or 2 3. How often do you have six or more drinks on one occasion?: Never Preliminary Score: 0 9. Have you or someone else been injured as a result of your drinking?: No 10. Has a relative or friend or a doctor or another health worker been concerned about your drinking or suggested you cut down?: No Alcohol Use Disorder Identification Test Final Score (AUDIT): 1 Brief Intervention: AUDIT score less than 7 or less-screening does not suggest unhealthy drinking-brief intervention not indicated Substance Abuse History in Jennifer last 12 months:  No. Consequences of Substance Abuse: NA Previous Psychotropic Medications: Yes  Psychological Evaluations: No  Past Medical History:  Past Medical History:  Diagnosis Date  . Anxiety   . Arthritis   . Depressed   . Hypertension   . Substance abuse     Past Surgical History:  Procedure Laterality Date  . TONSILLECTOMY     Family History: History reviewed. No pertinent family history.  Tobacco Screening: Have you used any form of tobacco in Jennifer last 30 days? (Cigarettes, Smokeless Tobacco, Cigars, and/or Pipes): [name] Refused Screening Social History:  History  Alcohol Use  . Yes    Comment: OCCASIONAL     History  Drug Use No    Additional Social History:      History of alcohol / drug use?: No history of alcohol / drug abuse                    Allergies:  No Known Allergies Lab Results:  Results for orders placed or performed during Jennifer hospital encounter of 11/04/16 (from Jennifer past 48 hour(s))  CBC     Status: Abnormal   Collection Time: 11/08/16  2:57 AM  Result Value Ref Range   WBC 6.7 4.0 - 10.5 K/uL   RBC 3.51 (L) 3.87 - 5.11 MIL/uL   Hemoglobin 10.4 (L) 12.0 - 15.0 g/dL   HCT 95.6 (L) 21.3 - 08.6 %   MCV 89.2 78.0 - 100.0 fL   MCH 29.6 26.0 - 34.0 pg   MCHC  33.2 30.0 - 36.0 g/dL   RDW 57.8 46.9 - 62.9 %   Platelets 232 150 - 400 K/uL  Basic metabolic panel     Status: Abnormal   Collection Time: 11/08/16  2:57 AM  Result Value Ref Range   Sodium 141 135 - 145 mmol/L   Potassium 3.4 (L) 3.5 - 5.1 mmol/L   Chloride 108 101 - 111 mmol/L   CO2 26 22 - 32 mmol/L   Glucose, Bld 86 65 - 99 mg/dL   BUN 6 6 - 20 mg/dL   Creatinine, Ser 5.28 0.44 - 1.00 mg/dL   Calcium 8.2 (L) 8.9 - 10.3 mg/dL   GFR calc non Af Amer >60 >60 mL/min   GFR calc Af Amer >60 >60 mL/min    Comment: (NOTE) Jennifer eGFR has been calculated using Jennifer CKD EPI equation. This calculation has not been validated in all clinical situations. eGFR's persistently <60 mL/min signify possible Chronic Kidney Disease.    Anion gap 7 5 - 15  Magnesium     Status: None   Collection Time: 11/08/16  2:57 AM  Result Value Ref Range   Magnesium 1.9 1.7 - 2.4 mg/dL  Phosphorus     Status: None   Collection Time: 11/08/16  2:57 AM  Result Value Ref Range   Phosphorus 4.1 2.5 - 4.6 mg/dL  Vitamin S49     Status: None   Collection Time: 11/08/16 10:06 AM  Result Value Ref Range   Vitamin B-12 828 180 - 914 pg/mL    Comment: (NOTE) This assay is not validated for testing neonatal or myeloproliferative syndrome specimens for Vitamin B12 levels.   Folate     Status: None   Collection Time: 11/08/16 10:06 AM  Result Value Ref Range   Folate 23.8 >5.9 ng/mL  Iron and TIBC     Status: Abnormal   Collection Time: 11/08/16 10:06 AM  Result Value Ref Range   Iron 68 28 - 170 ug/dL   TIBC 437 (L) 765 - 480 ug/dL   Saturation Ratios 30 10.4 - 31.8 %   UIBC 160 ug/dL  Ferritin     Status: None   Collection Time: 11/08/16 10:06 AM  Result Value Ref Range   Ferritin 84 11 - 307 ng/mL    Blood Alcohol level:  Lab Results  Component Value Date   ETH <5 11/04/2016    Metabolic Disorder Labs:  No results found for: HGBA1C, MPG No results found for: PROLACTIN Lab Results  Component  Value Date   TRIG 53 11/04/2016    Current Medications: Current Facility-Administered Medications  Medication Dose Route Frequency Provider Last Rate Last Dose  . acetaminophen (TYLENOL) tablet 650 mg  650 mg Oral Q6H PRN Jimmy Footman, MD   650 mg at 11/09/16 0823  . alum & mag hydroxide-simeth (MAALOX/MYLANTA) 200-200-20 MG/5ML suspension 30 mL  30 mL Oral Q4H PRN Jimmy Footman, MD      . amoxicillin-clavulanate (AUGMENTIN) 875-125 MG per tablet 1 tablet  1 tablet Oral Q12H Jimmy Footman, MD   1 tablet at 11/09/16 6366  . DULoxetine (CYMBALTA) DR capsule 30 mg  30 mg Oral Daily Jimmy Footman, MD   30 mg at 11/09/16 7044  . magnesium hydroxide (MILK OF MAGNESIA) suspension 30 mL  30 mL Oral Daily PRN Jimmy Footman, MD       PTA Medications: Prescriptions Prior to Admission  Medication Sig Dispense Refill Last Dose  . amoxicillin-clavulanate (AUGMENTIN) 875-125 MG tablet Take 1 tablet by mouth every 12 (twelve) hours. 6 tablet 0 11/08/2016 at 1300  . DULoxetine (CYMBALTA) 30 MG capsule Take 1 capsule (30 mg total) by mouth daily. 30 capsule 0 11/08/2016 at 1300  . ondansetron (ZOFRAN) 4 MG/5ML solution Take 5 mLs (4 mg total) by mouth every 8 (eight) hours as needed for nausea or vomiting. 50 mL 0 11/08/2016 at 1300    Musculoskeletal: Strength & Muscle Tone: decreased Gait & Station: unsteady [name] leans: N/A  Psychiatric Specialty Exam: I reviewed physical examination performed on medical floor and agree with Jennifer findings. Physical Exam  Nursing note and vitals reviewed. Psychiatric: Her speech is normal. Thought content normal. Her mood appears anxious. Her affect is labile. She is slowed and withdrawn. Cognition and memory are normal. She expresses impulsivity. She exhibits a depressed mood.    Review of Systems  Cardiovascular: Positive for chest pain and leg swelling.  Skin:       Bruise to Jennifer right tempke and  left knee.  Neurological: Positive for weakness.  Psychiatric/Behavioral: Positive for depression and suicidal ideas. Jennifer [name]  is nervous/anxious and has insomnia.   All other systems reviewed and are negative.   Blood pressure (!) 130/94, pulse (!) 111, temperature 98.5 F (36.9 C), resp. rate 18, height '5\' 11"'$  (1.803 m), weight 83.9 kg (185 lb), SpO2 100 %.Body mass index is 25.8 kg/m.  See SRA.                                                  Sleep:  Number of Hours: 3    Treatment Plan Summary: Daily contact with [name] to assess and evaluate symptoms and progress in treatment and Medication management   Ms. Blundell is a 31 year old female with a history of depression transferred from [place] floor where she was hospitalized after suicide attempt by overdose on baclofen in Jennifer context of major loss.  1. Suicidal ideation. Jennifer [name] is able to contract for safety in Jennifer hospital.   2. Mood. She was started on Cymbalta at [facility].  3. Insomnia. Slept 3 hours only. We will offer Trazodone.  4. Aspiration pneumonia. She is on Augmentin.  5. Swelling, numbness and pain of Jennifer legs. Spoke with Dr. Posey Pronto about consultation.   6. Metabolic syndrome monitoring. Lipid panel, TSH and HgbA1C are pending.  7. EKG. There was QTc prolongation caused by overdose that has resolved.  8. Disposition. She will be discharged with family. She needs follow up appointment in [place], [place] where she lives.    Observation Level/Precautions:  15 minute checks  Laboratory:  CBC Chemistry Profile UDS UA  Psychotherapy:    Medications:    Consultations:    Discharge Concerns:    Estimated LOS:  Other:     Physician Treatment Plan for Primary Diagnosis: Major depressive disorder, recurrent severe without psychotic features ([place]) Long Term Goal(s): Improvement in symptoms so as ready for discharge  Short Term Goals: Ability to identify changes in lifestyle to  reduce recurrence of condition will improve, Ability to verbalize feelings will improve, Ability to disclose and discuss suicidal ideas, Ability to demonstrate self-control will improve, Ability to identify and develop effective coping behaviors will improve, Ability to maintain clinical measurements within normal limits will improve, Compliance with prescribed medications will improve and Ability to identify triggers associated with substance abuse/mental health issues will improve  Physician Treatment Plan for Secondary Diagnosis: Principal Problem:   Major depressive disorder, recurrent severe without psychotic features ([place]) Active Problems:   Overdose   Aspiration into respiratory tract  Long Term Goal(s): NA  Short Term Goals: NA  I certify that inpatient services furnished can reasonably be expected to improve Jennifer [name]'s condition.    Orson Slick, MD 8/29/201811:22 AM

## 2016-11-09 NOTE — BHH Group Notes (Signed)
.  [facility] LCSW Group Therapy   11/09/2016  9:30 am   Type of Therapy: Group Therapy   Participation Level: Active   Participation Quality: Attentive, Sharing and Supportive   Affect: Appropriate   Cognitive: Alert and Oriented   Insight: Developing/Improving and Engaged   Engagement in Therapy: Developing/Improving and Engaged   Modes of Intervention: Clarification, Confrontation, Discussion, Education, Exploration, Limit-setting, Orientation, Problem-solving, Rapport Building, Dance movement psychotherapisteality Testing, Socialization and Support   Summary of Progress/Problems: The topic for group today was emotional regulation. This group focused on both positive and negative emotion identification and allowed  group members to process ways to identify feelings, regulate negative emotions, and find healthy ways to manage internal/external emotions. Group members were asked to reflect on a time when their reaction to an emotion led to a negative outcome and explored how alternative responses using emotion regulation would have benefited them. Group members were also asked to discuss a time when emotion regulation was utilized when a negative emotion was experienced. Patient defined emotion regulation and ways to regulate negative emotions. Patient identified negative emotions such as feelings sadness. She identified coping mechanisms and ways to regulate negative emotions.    Hampton AbbotKadijah Jearld Hemp, MSW, LCSWA 11/09/2016, 10:24AM

## 2016-11-09 NOTE — Progress Notes (Signed)
Patient discharge to [facility] via [facility]. Patient was alert oriented and vital signs were  stable.patient belongings given to her mom.patient able to verbalize understanding of question and answered.

## 2016-11-09 NOTE — Consult Note (Addendum)
[facility] - [place] [facility]   PATIENT NAME: Jennifer [name]    MR#:  [id number]  DATE OF BIRTH:  07/10/1985  DATE OF ADMISSION:  11/09/2016  PRIMARY CARE PHYSICIAN: Patient, No Pcp Per   REQUESTING/REFERRING PHYSICIAN: Dr Jennet Maduro  CHIEF COMPLAINT:  Left leg numbness, left leg pain. Redness left leg  HISTORY OF PRESENT ILLNESS:  Jennifer [name]  is a 31 y.o. female that was recently admitted to [facility] for drug overdose with baclofen. She was in the ICU for a few days. Being treated for aspiration pneumonia with Augmentin. She states that she had trouble walking when she was out of the ICU. It has gotten better. She has noticed some swelling of her left leg and some redness. She does have numbness on the lateral left leg. No complaints of back pain. She does have some pain in the thigh.  PAST MEDICAL HISTORY:   Past Medical History:  Diagnosis Date  . Anxiety   . Arthritis   . Depressed   . Hypertension   . Substance abuse     PAST SURGICAL HISTOIRY:   Past Surgical History:  Procedure Laterality Date  . TONSILLECTOMY      SOCIAL HISTORY:   Social History  Substance Use Topics  . Smoking status: Never Smoker  . Smokeless tobacco: Never Used  . Alcohol use Yes     Comment: OCCASIONAL    FAMILY HISTORY:   Family History  Problem Relation Age of Onset  . Anxiety disorder Mother   . Acute myelogenous leukemia Father   . CAD Father     DRUG ALLERGIES:  No Known Allergies  REVIEW OF SYSTEMS:  CONSTITUTIONAL: No fever. Left leg weakness. Some weight gain with IV fluids EYES: No blurred or double vision. Wears glasses EARS, NOSE, AND THROAT: No tinnitus or ear pain.  RESPIRATORY: some cough, noshortness of breath, wheezing or hemoptysis.  CARDIOVASCULAR: No chest pain, orthopnea, edema.  GASTROINTESTINAL: No nausea, vomiting, diarrhea or abdominal pain.  GENITOURINARY: No dysuria, hematuria.  ENDOCRINE: No polyuria,  nocturia,  HEMATOLOGY: No anemia, easy bruising or bleeding SKIN: No rash or lesion. Redness left leg. MUSCULOSKELETAL: positive for muscle pain left thigh.  NEUROLOGIC: No tingling, numbness, weakness.  PSYCHIATRY: No anxiety or depression.   MEDICATIONS AT HOME:   Prior to Admission medications   Medication Sig Start Date End Date Taking? Authorizing Provider  amoxicillin-clavulanate (AUGMENTIN) 875-125 MG tablet Take 1 tablet by mouth every 12 (twelve) hours. 11/08/16 11/11/16 Yes Rodolph Bong, MD  DULoxetine (CYMBALTA) 30 MG capsule Take 1 capsule (30 mg total) by mouth daily. 11/09/16  Yes Rodolph Bong, MD  ondansetron [facility]) 4 MG/5ML solution Take 5 mLs (4 mg total) by mouth every 8 (eight) hours as needed for nausea or vomiting. 11/08/16  Yes Rodolph Bong, MD      VITAL SIGNS:  Blood pressure (!) 130/94, pulse (!) 111, temperature 98.5 F (36.9 C), resp. rate 18, height 5\' 11"  (1.803 m), weight 83.9 kg (185 lb), SpO2 100 %.  PHYSICAL EXAMINATION:  GENERAL:  31 y.o.-year-old patient lying in the bed with no acute distress.  EYES: Pupils equal, round, reactive to light and accommodation. No scleral icterus. Extraocular muscles intact.  HEENT: Head atraumatic, normocephalic. Oropharynx and nasopharynx clear.  NECK:  Supple, no jugular venous distention. No thyroid enlargement, no tenderness.  LUNGS: Normal breath sounds bilaterally, no wheezing, rales,rhonchi or crepitation. No use of accessory muscles of respiration.  CARDIOVASCULAR: S1,  S2 normal. No murmurs, rubs, or gallops.  ABDOMEN: Soft, nontender, nondistended. Bowel sounds present. No organomegaly or mass.  EXTREMITIES:  1+edema, nocyanosis, or clubbing.  NEUROLOGIC: Cranial nerves II through XII are intact. Muscle strength 5/5 in all extremities. Sensation intact. Gait not checked.  PSYCHIATRIC: The patient is alert and oriented x 3.  SKIN: slight redness in the left thigh. Very slight warmth.  The  patient's nurse was present for the entire physical exam and history.  LABORATORY PANEL:   CBC  Recent Labs Lab 11/08/16 0257  WBC 6.7  HGB 10.4*  HCT 31.3*  PLT 232   ------------------------------------------------------------------------------------------------------------------  Chemistries   Recent Labs Lab 11/04/16 1232  11/08/16 0257  NA 141  < > 141  K 4.0  < > 3.4*  CL 109  < > 108  CO2 22  < > 26  GLUCOSE 123*  < > 86  BUN 9  < > 6  CREATININE 0.79  < > 0.55  CALCIUM 8.8*  < > 8.2*  MG  --   < > 1.9  AST 62*  --   --   ALT 28  --   --   ALKPHOS 42  --   --   BILITOT 1.1  --   --   < > = values in this interval not displayed. ------------------------------------------------------------------------------------------------------------------  Cardiac Enzymes  Recent Labs Lab 11/04/16 1520  TROPONINI 0.04*   ------------------------------------------------------------------------------------------------------------------   IMPRESSION AND PLAN:   1. Left leg pain. Possible cellulitis. Case discussed with pharmacist. Continue Augmentin and add doxycycline. Rule out DVT with an ultrasound of the left lower extremity. Check CPK. Numbness could be related to lateral femoral cutaneous nerve pressure with the swelling.  Could also have some lingering effects from baclofen getting out of the system. 2. Aspiration pneumonia on Augmentin. Likely will need a week total of treatment of this. 3. Depression with suicide attempt. Treatment as per psychiatry. Patient on Cymbalta. 4. Hypokalemia. Check electrolytes  All the records are reviewed and case discussed with Consulting provider. Management plans discussed with the patient, family and they are in agreement.  CODE STATUS: Full code  TOTAL TIME TAKING CARE OF THIS PATIENT: 50 minutes.    Alford HighlandWIETING, Jaiah Weigel M.D on 11/09/2016 at 3:38 PM  Between 7am to 6pm - Pager - [phone]  After 6pm go to [email or web address]  - password [email or web address]  [facility]  Office  [phone]  CC: Primary care Physician: Patient, No Pcp Per

## 2016-11-09 NOTE — Progress Notes (Addendum)
D- Alexis Guild 31 y.o. CF presented to [facility] escorted by security and pt's mother due to suicide attempt by drug overdose. Pt is a voluntary admission. Pt's father passed away on 30-Aug-2024May 12, 2018 due to lukemia, pt felt guilty due to her living in [place] and didn't make to father's beside before he passed. Pt's best friend recently shot self. Pt states her depression increased. Pt then overdosed on 32 Baclofen. Pt states she doesn't remember anything just waking up in the ER. Pt was in ICU and had to be intubated.Pt denies SI, HI or A/V hallucinations at this time. Pt states she is depressed due to her father's recent passing 8/10. Pt verbalized contract to safety.    A- Skin assessment with 2nd nurse ([place] RN) Pt has hematoma to right temple, skin is intact with redness, no blood or drainage noted. Pt also has a red abrasion to left knee, no s/s of bleeding or infection noted. Pt also has multiple bruises to bilateral antecubital and bilateral forearm due to IV insertion, no bleeding or signs of infection noted. Pt recently had a fall due to being unconscious after drug overdose. Pt pain level 3/10 left knee due to abrasion. Pt offered Tylenol but, pt refused and states she is ok and the pain is bearable  Pt searched for contraband, none found. Pt was offered food and fluids, pt refused. POC and unit policies were explained to pt. Pt verbalized understanding. Consent forms obtained.   R- Pt encouraged to contact staff for any questions or concerns.

## 2016-11-09 NOTE — Progress Notes (Signed)
Patient ID: [name], female   DOB: [birth year], 31 y.o.   MRN: [id number]  Laboratory data are reviewed. CPK elevated at 2401. [facility]the patient's CPK was 7143 on 11/04/2016. On 11/06/2016 is 2671. Rhabdomyolysis is another diagnosis for this patient. Since the patient's CPK has improved, can just encourage drinking fluids.  Dr. Alford Highlandichard Charistopher Rumble

## 2016-11-09 NOTE — Plan of Care (Signed)
Problem: Activity: Goal: Interest or engagement in activities will improve Outcome: Progressing Attending  unit programing  Goal: Sleeping patterns will improve Outcome: Progressing Voice no difficulties  With sleep  Problem: Education: Goal: Knowledge of [facility] Education information/materials will improve Outcome: Progressing Able to verbalize  Information  given Goal: Emotional status will improve Outcome: Progressing Appropriate  Behavior  Goal: Mental status will improve Outcome: Progressing Appropriate  Behavior   Able to verbalize needs  Goal: Verbalization of understanding the information provided will improve Outcome: Progressing Able to verbalize understand  Of information presented   Problem: Coping: Goal: Ability to verbalize frustrations and anger appropriately will improve Outcome: Progressing Working on coping skills  Goal: Ability to demonstrate self-control will improve Outcome: Progressing No inappropriate behavior  Noted   Problem: Health Behavior/Discharge Planning: Goal: Identification of resources available to assist in meeting health care needs will improve Outcome: Progressing Treatment  Team  will discuss  Discharge  Needs  And the appropriate  Resources  Goal: Compliance with treatment plan for underlying cause of condition will improve Outcome: Progressing Compliant  with unit  Programing   Problem: Safety: Goal: Periods of time without injury will increase Outcome: Progressing No injuries this admission   Problem: Coping: Goal: Ability to cope will improve Outcome: Progressing Working on  Coping  Skills Goal: Ability to verbalize feelings will improve Outcome: Progressing Attending  Unit  Programing / group therapy   Problem: Health Behavior/Discharge Planning: Goal: Ability to make decisions will improve Outcome: Progressing Working on Materials engineer  Goal: Compliance with therapeutic regimen will improve Outcome:  Progressing Working  On Materials engineer  And attending unit  Programing   Problem: Safety: Goal: Ability to disclose and discuss suicidal ideas will improve Outcome: Progressing Attending  therapy  Group  Discussing  Issues . Goal: Ability to identify and utilize support systems that promote safety will improve Outcome: Progressing  Patient  Able to verbalize feelings  And allow  Family to be a part of  Support  Team   Problem: Self-Concept: Goal: Level of anxiety will decrease Outcome: Progressing  Noted  Less  anxious this  am

## 2016-11-09 NOTE — [facility] Group Notes (Signed)
[facility] Group Notes:  (Nursing/MHT/Case Management/Adjunct)  Date:  11/09/2016  Time:  5:38 PM  Type of Therapy:  Psychoeducational Skills  Participation Level:  Active  Participation Quality:  Appropriate  Affect:  Appropriate  Cognitive:  Appropriate  Insight:  Appropriate  Engagement in Group:  Engaged  Modes of Intervention:  Socialization  Summary of Progress/Problems:  Lynelle SmokeCara Travis Mose Colaizzi 11/09/2016, 5:38 PM

## 2016-11-09 NOTE — [facility] Group Notes (Signed)
[facility] Group Notes:  (Nursing/MHT/Case Management/Adjunct)  Date:  11/09/2016  Time:  9:44 PM  Type of Therapy:  Group Therapy  Participation Level:  Minimal  Participation Quality:  Appropriate  Affect:  Appropriate  Cognitive:  Appropriate  Insight:  Good  Engagement in Group:  Limited  Modes of Intervention:  Support  Summary of Progress/Problems:  Jennifer [name] 11/09/2016, 9:44 PM

## 2016-11-10 LAB — CK: CK TOTAL: 1566 U/L — AB (ref 38–234)

## 2016-11-10 MED ORDER — IBUPROFEN 200 MG PO TABS
400.0000 mg | ORAL_TABLET | Freq: Four times a day (QID) | ORAL | Status: DC | PRN
  Administered 2016-11-10 (×2): 400 mg via ORAL
  Filled 2016-11-10 (×2): qty 2

## 2016-11-10 MED ORDER — FLUOXETINE HCL 20 MG PO CAPS
40.0000 mg | ORAL_CAPSULE | Freq: Every day | ORAL | Status: DC
  Administered 2016-11-11: 40 mg via ORAL
  Filled 2016-11-10: qty 2

## 2016-11-10 MED ORDER — METOPROLOL TARTRATE 25 MG PO TABS
25.0000 mg | ORAL_TABLET | Freq: Two times a day (BID) | ORAL | Status: DC
  Administered 2016-11-10 – 2016-11-11 (×3): 25 mg via ORAL
  Filled 2016-11-10 (×4): qty 1

## 2016-11-10 NOTE — Progress Notes (Signed)
Recreation Therapy Notes  INPATIENT RECREATION THERAPY ASSESSMENT  Patient Details Name: Jennifer [name] MRN: [id number] DOB: [birth year] Today's Date: 11/10/2016  Patient Stressors: Death, Other (Comment) (Dad died on August 12th - pt lives in [place] and was not able to be there when he died, pt reported they tried to keep him alive for her to get there and she feel guilty for him suffering and not being there when he passed; finances)  Coping Skills:   Isolate, Avoidance, Exercise, Talking, Art/Dance, Music, Other (Comment) (Clean, sit outside)  Personal Challenges: Communication, Concentration, Decision-Making, Problem-Solving, Self-Esteem/Confidence, Social Interaction, Stress Management, Substance Abuse, Trusting Others  Leisure Interests (2+):  Nature - Museum/gallery exhibitions officerHiking, Individual - Other (Comment) Engineer, manufacturing systems(Photography)  Awareness of Community Resources:  Yes  Community Resources:  Park, [place], Research scientist (physical sciences)Movie Theaters, Other (Comment) ([place])  Current Use: No  If no, Barriers?: Other (Comment) (No motivation)  Patient Strengths:  Kind, artist  Patient Identified Areas of Improvement:  Social skills, coping skill, confidence  Current Recreation Participation:  Carowinds  Patient Goal for Hospitalization:  To feel better and get medication to help her  [place] Residence[place]  [place], [place]  [place] Residence:  [place]   Current [place] (including self-harm):  No  Current HI:  No  Consent to Intern Participation: N/A   [name],Jennifer Strada M, LRT/CTRS 11/10/2016, 4:10 PM

## 2016-11-10 NOTE — Progress Notes (Signed)
[facility] MD Progress Note  11/10/2016 3:03 PM Jennifer [name]  MRN:  [id number]  Subjective:    11/10/2016. Jennifer [name] feerls better physically today but swelling of her left leg is still visible. She was evaluated by medicine service. DVT was ruled out. Mood is improving, affect is brighter. She complains of palpitations that she believes are from Cymbalta. She had similar symptoms with Effexor. She now wants to switch to Prozac. Good group participation. Sleep has much improved.  Per nursing: Pt is in a pleasant mood. Pt is still a little anxious. Pt did go into dayroom and interact with peers. Pt states she is feeling much better, the staff is so helpful and nice. Pt is compliant with meds, no adverse affect noted. Denies SI, HI or A/V hallucinations. Pt denies depression at this time. Pt says she just needed someone to talk to, pt encourage at anytime she needs to talk staff is available. Pt did have left knee pain 5/10, pt given Tylenol 650 mg tab po. Pt tolerable pain level is 2/10. Pt reassessed and pain level is now 2/10. 15 min safety checks continues.  Principal Problem: Major depressive disorder, recurrent severe without psychotic features ([place]) Diagnosis:   Patient Active Problem List   Diagnosis Date Noted  . Drug overdose, intentional, initial encounter ([place]) [T50.902A] 11/08/2016  . Suicidal ideation [R45.851] 11/08/2016  . Major depressive disorder, recurrent severe without psychotic features ([place]) [F33.2] 11/08/2016  . Hypokalemia [E87.6] 11/08/2016  . Acute respiratory failure with hypoxia ([place]) [J96.01] 11/08/2016  . Aspiration into respiratory tract [T17.908A] 11/08/2016  . QT prolongation [R94.31] 11/08/2016  . Acute metabolic encephalopathy [T34.28] 11/08/2016  . Pneumothorax on right: small apical [J93.9] 11/08/2016  . Overdose, intentional self-harm, initial encounter ([place]) [T50.902A]   . Overdose [T50.901A] 11/04/2016   Total Time spent with patient: 30  minutes  Past Psychiatric History: depression.  Past Medical History:  Past Medical History:  Diagnosis Date  . Anxiety   . Arthritis   . Depressed   . Hypertension   . Substance abuse     Past Surgical History:  Procedure Laterality Date  . TONSILLECTOMY     Family History:  Family History  Problem Relation Age of Onset  . Anxiety disorder Mother   . Acute myelogenous leukemia Father   . CAD Father    Family Psychiatric  History: depression and anxiety. Social History:  History  Alcohol Use  . Yes    Comment: OCCASIONAL     History  Drug Use No    Social History   Social History  . Marital status: Single    Spouse name: N/A  . Number of children: N/A  . Years of education: N/A   Social History Main Topics  . Smoking status: Never Smoker  . Smokeless tobacco: Never Used  . Alcohol use Yes     Comment: OCCASIONAL  . Drug use: No  . Sexual activity: No   Other Topics Concern  . None   Social History Narrative  . None   Additional Social History:    History of alcohol / drug use?: No history of alcohol / drug abuse                    Sleep: Poor  Appetite:  Fair  Current Medications: Current Facility-Administered Medications  Medication Dose Route Frequency Provider Last Rate Last Dose  . acetaminophen (TYLENOL) tablet 650 mg  650 mg Oral Q6H PRN Hildred Priest, MD  650 mg at 11/09/16 2038  . alum & mag hydroxide-simeth (MAALOX/MYLANTA) 200-200-20 MG/5ML suspension 30 mL  30 mL Oral Q4H PRN Hildred Priest, MD      . amoxicillin-clavulanate (AUGMENTIN) 875-125 MG per tablet 1 tablet  1 tablet Oral Q12H Hildred Priest, MD   1 tablet at 11/10/16 [phone]  . doxycycline (VIBRA-TABS) tablet 100 mg  100 mg Oral Q12H Loletha Grayer, MD   100 mg at 11/10/16 6294  . DULoxetine (CYMBALTA) DR capsule 30 mg  30 mg Oral Daily Hildred Priest, MD   30 mg at 11/10/16 7654  . fluvoxaMINE (LUVOX) tablet 50 mg   50 mg Oral QHS Weyman Bogdon B, MD   50 mg at 11/09/16 2104  . ibuprofen (ADVIL,MOTRIN) tablet 400 mg  400 mg Oral Q6H PRN Gladstone Lighter, MD      . magnesium hydroxide (MILK OF MAGNESIA) suspension 30 mL  30 mL Oral Daily PRN Hildred Priest, MD      . metoprolol tartrate (LOPRESSOR) tablet 25 mg  25 mg Oral BID Gladstone Lighter, MD   25 mg at 11/10/16 1217  . zolpidem (AMBIEN) tablet 5 mg  5 mg Oral QHS PRN Fayette Hamada B, MD        Lab Results:  Results for orders placed or performed during the hospital encounter of 11/09/16 (from the past 48 hour(s))  CK     Status: Abnormal   Collection Time: 11/09/16  4:01 PM  Result Value Ref Range   Total CK 2,401 (H) 38 - 234 U/L  Basic metabolic panel     Status: None   Collection Time: 11/09/16  4:01 PM  Result Value Ref Range   Sodium 137 135 - 145 mmol/L   Potassium 4.4 3.5 - 5.1 mmol/L   Chloride 101 101 - 111 mmol/L   CO2 27 22 - 32 mmol/L   Glucose, Bld 92 65 - 99 mg/dL   BUN 11 6 - 20 mg/dL   Creatinine, Ser 0.65 0.44 - 1.00 mg/dL   Calcium 9.3 8.9 - 10.3 mg/dL   GFR calc non Af Amer >60 >60 mL/min   GFR calc Af Amer >60 >60 mL/min    Comment: (NOTE) The eGFR has been calculated using the CKD EPI equation. This calculation has not been validated in all clinical situations. eGFR's persistently <60 mL/min signify possible Chronic Kidney Disease.    Anion gap 9 5 - 15  Magnesium     Status: None   Collection Time: 11/09/16  4:01 PM  Result Value Ref Range   Magnesium 2.1 1.7 - 2.4 mg/dL  Sedimentation rate     Status: Abnormal   Collection Time: 11/09/16  4:01 PM  Result Value Ref Range   Sed Rate 64 (H) 0 - 20 mm/hr  CBC     Status: None   Collection Time: 11/09/16  4:01 PM  Result Value Ref Range   WBC 5.7 3.6 - 11.0 K/uL   RBC 4.04 3.80 - 5.20 MIL/uL   Hemoglobin 12.6 12.0 - 16.0 g/dL   HCT 36.3 35.0 - 47.0 %   MCV 89.9 80.0 - 100.0 fL   MCH 31.3 26.0 - 34.0 pg   MCHC 34.8 32.0 - 36.0  g/dL   RDW 13.3 11.5 - 14.5 %   Platelets 334 150 - 440 K/uL  CK     Status: Abnormal   Collection Time: 11/10/16  6:55 AM  Result Value Ref Range   Total CK 1,566 (H) 38 -  234 U/L    Blood Alcohol level:  Lab Results  Component Value Date   ETH <5 53/97/6734    Metabolic Disorder Labs: No results found for: HGBA1C, MPG No results found for: PROLACTIN Lab Results  Component Value Date   TRIG 53 11/04/2016    Physical Findings: AIMS: Facial and Oral Movements Muscles of Facial Expression: None, normal Lips and Perioral Area: None, normal Jaw: None, normal Tongue: None, normal,Extremity Movements Upper (arms, wrists, hands, fingers): None, normal Lower (legs, knees, ankles, toes): None, normal, Trunk Movements Neck, shoulders, hips: None, normal, Overall Severity Severity of abnormal movements (highest score from questions above): None, normal Incapacitation due to abnormal movements: None, normal Patient's awareness of abnormal movements (rate only patient's report): No Awareness, Dental Status Current problems with teeth and/or dentures?: No Does patient usually wear dentures?: No  CIWA:  CIWA-Ar Total: 0 COWS:  COWS Total Score: 0  Musculoskeletal: Strength & Muscle Tone: decreased Gait & Station: unsteady Patient leans: N/A  Psychiatric Specialty Exam: Physical Exam  Nursing note and vitals reviewed. Psychiatric: Her speech is normal. Thought content normal. Her mood appears anxious. She is withdrawn. Cognition and memory are normal. She expresses impulsivity. She exhibits a depressed mood.    Review of Systems  Cardiovascular: Positive for chest pain and leg swelling.  Musculoskeletal: Positive for joint pain.  Neurological: Positive for weakness.  Psychiatric/Behavioral: Positive for depression and suicidal ideas. The patient is nervous/anxious and has insomnia.   All other systems reviewed and are negative.   Blood pressure (!) 133/95, pulse (!) 135,  temperature 98 F (36.7 C), resp. rate 18, height '5\' 11"'$  (1.803 m), weight 83.9 kg (185 lb), SpO2 100 %.Body mass index is 25.8 kg/m.  General Appearance: Casual  Eye Contact:  Good  Speech:  Clear and Coherent  Volume:  Decreased  Mood:  Anxious and Depressed  Affect:  Blunt  Thought Process:  Goal Directed and Descriptions of Associations: Intact  Orientation:  Full (Time, Place, and Person)  Thought Content:  WDL  Suicidal Thoughts:  No  Homicidal Thoughts:  No  Memory:  Immediate;   Fair Recent;   Fair Remote;   Fair  Judgement:  Impaired  Insight:  Present  Psychomotor Activity:  Psychomotor Retardation  Concentration:  Concentration: Fair and Attention Span: Fair  Recall:  [employer] of Knowledge:  Fair  Language:  Fair  Akathisia:  No  Handed:  Right  AIMS (if indicated):     Assets:  Communication Skills Desire for Improvement Housing Intimacy Resilience Social Support  ADL's:  Intact  Cognition:  WNL  Sleep:  Number of Hours: 5.3     Treatment Plan Summary: Daily contact with patient to assess and evaluate symptoms and progress in treatment and Medication management   Jennifer [name] is a 31 year old female with a history of depression transferred from [place] floor where she was hospitalized after suicide attempt by overdose on baclofen in the context of major loss.  1. Suicidal ideation. The patient is able to contract for safety in the hospital.   2. Mood. We discontinued Cymbalta due to side effects. Started Prozac.   3. Insomnia. Slept 5 hours with Trazodone.  4. Aspiration pneumonia. She is on Augmentin.  5. Swelling, numbness and pain of the legs. Doppler was negative. Medicine input is greatly appreciated.   6. QTc prolongation. Resolved. EKG shows normal sinus rhythm, QTc 418.   7. Disposition. She will be discharged with family. She needs  follow up appointment in [place], [place] where she lives.   Orson Slick, MD 11/10/2016,  3:03 PM

## 2016-11-10 NOTE — [facility] Group Notes (Signed)
[facility] LCSW Group Therapy   11/10/2016 9:30 am   Type of Therapy: Group Therapy   Participation Level: Patient invited but did not attend.   Shadae Reino, MSW, LCSW-A 11/10/2016, 10:15AM   

## 2016-11-10 NOTE — Progress Notes (Signed)
Patient verbalized that she feels much better today.Pleasant and cooperative in the milieu.Appropriate with staff & peers.Compliant with medications.Attended groups.PRN pain meds given for L led pain.Support & encouragement given.

## 2016-11-10 NOTE — Progress Notes (Signed)
Recreation Therapy Notes  Date: 08.30.18 Time: 1:00 pm Location: Craft Room  Group Topic: Leisure Education  Goal Area(s) Addresses:  Patient will identify activities for each letter of the alphabet. Patient will verbalize ability to integrate positive leisure into life post d/c. Patient will verbalize ability to use leisure as a Associate Professorcoping skill.  Behavioral Response: Attentive, Interactive  Intervention: Leisure Education  Activity: Patients were given a Leisure Information systems managerAlphabet worksheet and were instructed to write healthy leisure activities for each letter of the alphabet.  Education: LRT educated patients on what they need to participate in leisure.   Education Outcome: Acknowledges education/In group clarification offered   Clinical Observations/Feedback: Patient wrote healthy leisure activities. Patient contributed to group discussion by stating healthy leisure activities, what she needs to participate in leisure, and what makes leisure a good coping skill.  [name],Alaze Garverick M, LRT/CTRS 11/10/2016 1:44 PM

## 2016-11-10 NOTE — Progress Notes (Signed)
[facility] [place] [facility]   PATIENT NAME: Jennifer [name]    MR#:  [id number]  DATE OF BIRTH:  07-Jan-1986  SUBJECTIVE:  CHIEF COMPLAINT:  No chief complaint on file.  - still complaining of left thigh and right thigh pain, left greater than right - mood is much improved. Gets tachycardic with cymbalta  REVIEW OF SYSTEMS:  Review of Systems  Constitutional: Negative for chills, fever and malaise/fatigue.  HENT: Negative for congestion, ear discharge, hearing loss and nosebleeds.   Eyes: Negative for blurred vision and double vision.  Respiratory: Negative for cough, shortness of breath and wheezing.   Cardiovascular: Positive for leg swelling. Negative for chest pain and palpitations.  Gastrointestinal: Negative for abdominal pain, constipation, diarrhea, nausea and vomiting.  Genitourinary: Negative for dysuria and urgency.  Musculoskeletal: Positive for myalgias.  Neurological: Negative for dizziness, speech change, focal weakness, seizures and headaches.  Psychiatric/Behavioral: Positive for depression. The patient is nervous/anxious.     DRUG ALLERGIES:  No Known Allergies  VITALS:  Blood pressure (!) 133/95, pulse (!) 135, temperature 98 F (36.7 C), resp. rate 18, height 5\' 11"  (1.803 m), weight 83.9 kg (185 lb), SpO2 100 %.  PHYSICAL EXAMINATION:  Physical Exam  GENERAL:  31 y.o.-year-old patient sitting in the bed with no acute distress.  EYES: Pupils equal, round, reactive to light and accommodation. No scleral icterus. Extraocular muscles intact.  HEENT: Head atraumatic, normocephalic. Oropharynx and nasopharynx clear.  NECK:  Supple, no jugular venous distention. No thyroid enlargement, no tenderness.  LUNGS: Normal breath sounds bilaterally, no wheezing, rales,rhonchi or crepitation. No use of accessory muscles of respiration.  CARDIOVASCULAR: S1, S2 normal. No murmurs, rubs, or gallops.  ABDOMEN: Soft, nontender, nondistended.  Bowel sounds present. No organomegaly or mass.  EXTREMITIES: left thigh is more swollen than right, no erythema or tenderness or ecchymoes noted. No pedal edema, cyanosis, or clubbing.  NEUROLOGIC: Cranial nerves II through XII are intact. Muscle strength 5/5 in all extremities. Sensation intact. Gait not checked.  PSYCHIATRIC: The patient is alert and oriented x 3.  SKIN: No obvious rash, lesion, or ulcer.    LABORATORY PANEL:   CBC  Recent Labs Lab 11/09/16 1601  WBC 5.7  HGB 12.6  HCT 36.3  PLT 334   ------------------------------------------------------------------------------------------------------------------  Chemistries   Recent Labs Lab 11/04/16 1232  11/09/16 1601  NA 141  < > 137  K 4.0  < > 4.4  CL 109  < > 101  CO2 22  < > 27  GLUCOSE 123*  < > 92  BUN 9  < > 11  CREATININE 0.79  < > 0.65  CALCIUM 8.8*  < > 9.3  MG  --   < > 2.1  AST 62*  --   --   ALT 28  --   --   ALKPHOS 42  --   --   BILITOT 1.1  --   --   < > = values in this interval not displayed. ------------------------------------------------------------------------------------------------------------------  Cardiac Enzymes  Recent Labs Lab 11/04/16 1520  TROPONINI 0.04*   ------------------------------------------------------------------------------------------------------------------  RADIOLOGY:  US Venous Img Lower Unilateral Left  Result Date: 11/09/2016 CLINICAL DATA:  Initial evaluation for acute left lower extremity pain and swelling with numbness for 3 days. EXAM: Left LOWER EXTREMITY VENOUS DOPPLER ULTRASOUND TECHNIQUE: Gray-scale sonography with graded compression, as well as color Doppler and duplex ultrasound were performed to evaluate the lower extremity deep venous systems from the level of  the common femoral vein and including the common femoral, femoral, profunda femoral, popliteal and calf veins including the posterior tibial, peroneal and gastrocnemius veins when  visible. The superficial great saphenous vein was also interrogated. Spectral Doppler was utilized to evaluate flow at rest and with distal augmentation maneuvers in the common femoral, femoral and popliteal veins. COMPARISON:  None. FINDINGS: Contralateral Common Femoral Vein: Respiratory phasicity is normal and symmetric with the symptomatic side. No evidence of thrombus. Normal compressibility. Common Femoral Vein: No evidence of thrombus. Normal compressibility, respiratory phasicity and response to augmentation. Saphenofemoral Junction: No evidence of thrombus. Normal compressibility and flow on color Doppler imaging. Profunda Femoral Vein: No evidence of thrombus. Normal compressibility and flow on color Doppler imaging. Femoral Vein: No evidence of thrombus. Normal compressibility, respiratory phasicity and response to augmentation. Popliteal Vein: No evidence of thrombus. Normal compressibility, respiratory phasicity and response to augmentation. Calf Veins: No evidence of thrombus. Normal compressibility and flow on color Doppler imaging. Superficial Great Saphenous Vein: No evidence of thrombus. Normal compressibility and flow on color Doppler imaging. Venous Reflux:  None. Other Findings:  None. IMPRESSION: No evidence of DVT within the left lower extremity. Electronically Signed   By: Rise MuBenjamin  McClintock M.D.   On: 11/09/2016 18:08    EKG:   Orders placed or performed during the hospital encounter of 11/04/16  . ED EKG  . ED EKG  . EKG 12-Lead  . EKG 12-Lead  . EKG  . EKG 12-Lead  . EKG 12-Lead    ASSESSMENT AND PLAN:   31 year old female with past medical history significant for depression and anxiety, hypertension, substance abuse who overdosed on baclofen and got [facility]04/2016 was transferred to behavioral medicine unit here after cleared from medicine. Medical consult requested now for left thigh swelling and pain.  #1 left thigh pain-secondary to  rhabdomyolysis and muscular inflammation. -add motrin prn for pain - ice pack prn and encourage to drink fluids. -Also thigh-high teds recommended.Dopplers negative for DVT. -CPK elevated, but improving. Paresthesias improving as well. -On Augmentin and doxycycline,will stop in 5 days  #2 aspiration pneumonia-already on Augmentin. Stop after 5 more days  #3 depression with anxiety-drug overdose with baclofen. Treatment per psychiatry. -Cymbalta causing sinus tachycardia, being changed to Prozac. Also on Luvox  #4 sinus tachycardia-added low-dose metoprolol for now.   Discussed with Dr. Jennet MaduroPucilowska, patient is otherwise medically stable. We'll sign off.     All the records are reviewed and case discussed with Care Management/Social Workerr. Management plans discussed with the patient, family and they are in agreement.  CODE STATUS: Full Code  TOTAL TIME TAKING CARE OF THIS PATIENT: 38 minutes.      Kyrie Bun M.D on 11/10/2016 at 12:46 PM  Between 7am to 6pm - Pager - [phone]  After 6pm go to [email or web address] - password [email or web address]  [facility] [facility]  Office  [phone]  CC: Primary care physician; Patient, No Pcp Per
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
## 2016-11-10 NOTE — Plan of Care (Signed)
Problem: Coping: Goal: Ability to cope will improve Outcome: Progressing Patient is pleasant and cooperative in the unit.  Problem: Safety: Goal: Ability to disclose and discuss suicidal ideas will improve Outcome: Progressing Patient is free from injury

## 2016-11-10 NOTE — Progress Notes (Deleted)
Recreation Therapy Notes  At approximately 3:55 pm, LRT attempted assessment. Patient was resting and requested for LRT to come back tomorrow to complete assessment.  [name],Roizy Harold M, LRT/CTRS 11/10/2016 4:01 PM

## 2016-11-11 LAB — BASIC METABOLIC PANEL
Anion gap: 9 (ref 5–15)
BUN: 15 mg/dL (ref 6–20)
CHLORIDE: 102 mmol/L (ref 101–111)
CO2: 28 mmol/L (ref 22–32)
Calcium: 9.3 mg/dL (ref 8.9–10.3)
Creatinine, Ser: 0.66 mg/dL (ref 0.44–1.00)
GFR calc non Af Amer: >60 mL/min (ref >60)
Glucose, Bld: 89 mg/dL (ref 65–99)
POTASSIUM: 3.8 mmol/L (ref 3.5–5.1)
Sodium: 139 mmol/L (ref 135–145)

## 2016-11-11 LAB — CK: Total CK: 943 U/L — ABNORMAL HIGH (ref 38–234)

## 2016-11-11 MED ORDER — AMOXICILLIN-POT CLAVULANATE 875-125 MG PO TABS: 1.0000 | ORAL_TABLET | Freq: Two times a day (BID) | ORAL | 0 refills | Status: AC

## 2016-11-11 MED ORDER — FLUOXETINE HCL 20 MG PO CAPS: 60.0000 mg | ORAL_CAPSULE | Freq: Every day | ORAL | Status: DC

## 2016-11-11 MED ORDER — METOPROLOL TARTRATE 25 MG PO TABS: 25.0000 mg | ORAL_TABLET | Freq: Two times a day (BID) | ORAL | 1 refills | Status: AC

## 2016-11-11 MED ORDER — DOXYCYCLINE HYCLATE 100 MG PO TABS: 100.0000 mg | ORAL_TABLET | Freq: Two times a day (BID) | ORAL | 0 refills | Status: AC

## 2016-11-11 MED ORDER — FLUOXETINE HCL 20 MG PO CAPS: 60.0000 mg | ORAL_CAPSULE | Freq: Every day | ORAL | 1 refills | Status: AC

## 2016-11-11 NOTE — [facility] Suicide Risk Assessment (Signed)
[facility]   Principal Problem: Major depressive disorder, recurrent severe without psychotic features [facility](HCC) Discharge Diagnoses:  Patient Active Problem List   Diagnosis Date Noted  . Drug overdose, intentional, initial encounter (HCC) [T50.902A] 11/08/2016  . Suicidal ideation [R45.851] 11/08/2016  . Major depressive disorder, recurrent severe without psychotic features (HCC) [F33.2] 11/08/2016  . Hypokalemia [E87.6] 11/08/2016  . Acute respiratory failure with hypoxia (HCC) [J96.01] 11/08/2016  . Aspiration into respiratory tract [T17.908A] 11/08/2016  . QT prolongation [R94.31] 11/08/2016  . Acute metabolic encephalopathy [G93.41] 11/08/2016  . Pneumothorax on right: small apical [J93.9] 11/08/2016  . Overdose, intentional self-harm, initial encounter (HCC) [T50.902A]   . Overdose [T50.901A] 11/04/2016    Total Time spent with patient: 30 minutes  Musculoskeletal: Strength & Muscle Tone: within normal limits Gait & Station: normal Patient leans: N/A  Psychiatric Specialty Exam: Review of Systems  All other systems reviewed and are negative.   Blood pressure 129/82, pulse (!) 108, temperature 98.7 F (37.1 C), temperature source Oral, resp. rate 18, height 5\' 11"  (1.803 m), weight 83.9 kg (185 lb), SpO2 100 %.Body mass index is 25.8 kg/m.  General Appearance: Casual  Eye Contact::  Good  Speech:  Clear and Coherent409  Volume:  Normal  Mood:  Anxious  Affect:  Appropriate  Thought Process:  Goal Directed and Descriptions of Associations: Intact  Orientation:  Full (Time, Place, and Person)  Thought Content:  WDL  Suicidal Thoughts:  No  Homicidal Thoughts:  No  Memory:  Immediate;   Fair Recent;   Fair Remote;   Fair  Judgement:  Impaired  Insight:  Present  Psychomotor Activity:  Normal  Concentration:  Fair  Recall:  [employer]  Fund of Knowledge:Fair  Language: Fair  Akathisia:  No  Handed:  Right  AIMS (if indicated):     Assets:   Communication Skills Desire for Improvement Housing Physical Health Resilience Social Support  Sleep:  Number of Hours: 5.3  Cognition: WNL  ADL's:  Intact   Mental Status Per Nursing Assessment::   On Admission:     Demographic Factors:  Caucasian  Loss Factors: Loss of significant relationship  Historical Factors: Family history of mental illness or substance abuse and Impulsivity  Risk Reduction Factors:   Sense of responsibility to family, Employed, Living with another person, especially a relative and Positive social support  Continued Clinical Symptoms:  Depression:   Impulsivity  Cognitive Features That Contribute To Risk:  None    Suicide Risk:  Minimal: No identifiable suicidal ideation.  Patients presenting with no risk factors but with morbid ruminations; may be classified as minimal risk based on the severity of the depressive symptoms  Follow-up Information    [facility]. Go on 11/21/2016.   Why:  Please attend your follow up appointment on Monday, 11/21/16, at 8:30am.  Please bring a copy of your hospital discharge paperwork. Contact information: [address] [place], [place] [id number] P: [phone] F: [phone]          Plan Of Care/Follow-up recommendations:  Activity:  As tolerated. Diet:  Regular. Other:  Keep follow-up appointments.  Kristine LineaJolanta Adelayde Minney, MD 11/11/2016, 4:19 PM

## 2016-11-11 NOTE — Progress Notes (Signed)
Pt reports she will be in [place] for one more week and then will return to [place] where she lives with her fiancee.  Follow up appt was made for 11/21/16 based on this schedule. Garner NashGregory Jailah Willis, MSW, LCSW Clinical Social Worker 11/11/2016 4:04 PM

## 2016-11-11 NOTE — B[facility] Group Notes (Signed)
B[facility] LCSW Group Therapy Note  Date/Time: 11/11/16, 0930  Type of Therapy and Topic:  Group Therapy:  Feelings around Relapse and Recovery  Participation Level:  Active   Mood:pleasant  Description of Group:    Patients in this group will discuss emotions they experience before and after a relapse. They will process how experiencing these feelings, or avoidance of experiencing them, relates to having a relapse. Facilitator will guide patients to explore emotions they have related to recovery. Patients will be encouraged to process which emotions are more powerful. They will be guided to discuss the emotional reaction significant others in their lives may have to patients' relapse or recovery. Patients will be assisted in exploring ways to respond to the emotions of others without this contributing to a relapse.  Therapeutic Goals: 1. Patient will identify two or more emotions that lead to relapse for them:  2. Patient will identify two emotions that result when they relapse:  3. Patient will identify two emotions related to recovery:  4. Patient will demonstrate ability to communicate their needs through discussion and/or role plays.   Summary of Patient Progress: Pt identified feeling "alone" as a difficult emotion for her.  Pt participated in group discussion regarding use of substances as a way to handle negative feelings.  Pt reported that interacting with pets is one positive way she has discovered to deal with difficult emotions.     Therapeutic Modalities:   Cognitive Behavioral Therapy Solution-Focused Therapy Assertiveness Training Relapse Prevention Therapy  Jennifer SquibbGreg Jolanda Mccann, LCSW

## 2016-11-11 NOTE — BHH Suicide Risk Assessment (Signed)
B[facility] INPATIENT:  Family/Significant Other Suicide Prevention Education  Suicide Prevention Education:  Education Completed; Gweneth DimitriGerrett Shanower, fiance   [phone],  (name of family member/significant other) has been identified by the patient as the family member/significant other with whom the patient will be residing, and identified as the person(s) who will aid the patient in the event of a mental health crisis (suicidal ideations/suicide attempt).  With written consent from the patient, the family member/significant other has been provided the following suicide prevention education, prior to the and/or following the discharge of the patient.  The suicide prevention education provided includes the following:  Suicide risk factors  Suicide prevention and interventions  National Suicide Hotline telephone number  [facility] assessment telephone number  [facility] Emergency Assistance 911  [facility] and/or Residential Mobile Crisis Unit telephone number  Request made of family/significant other to:  Remove weapons (e.g., guns, rifles, knives), all items previously/currently identified as safety concern.    Remove drugs/medications (over-the-counter, prescriptions, illicit drugs), all items previously/currently identified as a safety concern.  The family member/significant other verbalizes understanding of the suicide prevention education information provided.  The family member/significant other agrees to remove the items of safety concern listed above. Patient plans to stay with mother at discharge and mother and sister have done a sweep of the home and mother does have a gun in the home, but it is locked and secured with patient not having the ability to get guns.  Raye SorrowCoble, Eligah Anello N 11/11/2016, 3:36 PM

## 2016-11-11 NOTE — Plan of Care (Signed)
Problem: [facility] Participation in Recreation Therapeutic Interventions Goal: STG-Patient will demonstrate improved self esteem by identif STG: Self-Esteem - Within 4 treatment sessions, patient will verbalize at least 5 positive affirmation statements in each of 2 treatment sessions to increase self-esteem.  Outcome: Progressing Treatment Session 1; Completed 1 out of 2: At approximately 2:00 pm, LRT met with patient in craft room. Patient verbalized 5 positive affirmation statements. Patient reported it felt "good". LRT encouraged patient to continue saying positive affirmation statements.   Leonette Monarch, LRT/CTRS 08.31.18 2:15 pm Goal: STG-Other Recreation Therapy Goal (Specify) STG: Stress Management - Within 4 treatment sessions, patient will verbalize understanding of the stress management techniques in each of 2 treatment sessions to increase stress management skills.  Outcome: Progressing Treatment Session 1; Completed 1 out of 2: At approximately 2:00 pm, LRT met with patient in craft room. LRT educated and provided patient with handouts on stress management techniques. Patient verbalized understanding. LRT encouraged patient to read over and practice the stress management techniques.  Leonette Monarch, LRT/CTRS 08.31.18 2:17 pm

## 2016-11-11 NOTE — Discharge Summary (Signed)
Physician Discharge Summary Note  Patient:  Jennifer [name] is an 31 y.o., female MRN:  [id number] DOB:  Feb 05, 1986 Patient phone:  [phone] (home)  Patient address:   [address] [place] [address],  Total Time spent with patient: 30 minutes  Date of Admission:  11/09/2016 Date of Discharge: 11/11/2016  Reason for Admission:  Overdose.  Identifying data. Jennifer [name] is a 31 year old female with a history of depression.  Chief complaint. "I feel stupid."  History of present illness. Information was obtained from the patient and the chart. The patient was transferred to [facility] from [facility] medical floor where she was briefly hospitalized after suicide attempt by baclofen overdose. The patient reportedly took 32 pills of baclofen and was found unconscious on the floor of the bathroom by her family. Per chart, suicidal note was found as well. The patient denies writing a suicide note but admits that she had her father's ashes next to her at the time of overdose. The patient experienced multiple losses recently including sudden death of her father. He died of a heart attack within 1 hour and the patient did not make it to the hospital in time. Reportedly the father was asking for her. She felt very guilty and decided to overdose. The patient reports many symptoms of depression with poor sleep, decreased appetite, anhedonia, feeling of guilt and hopelessness worthlessness, poor energy and concentration, social isolation, crying spells, high anxiety, and suicidal ideation that culminated in suicide attempt. She denies psychotic symptoms or symptoms suggestive of bipolar mania. She denies history of mania but was diagnosed with bipolar disorder due to frequent bouts of severe depression. She reports reports anxiety especially social anxiety and OCD type symptoms. There is no alcohol or drug use.  Past psychiatric history. She has never been hospitalized or  attempted suicide. She has been tried on several antidepressants including Wellbutrin, Prozac, Effexor and Viibryd. As she did well on Prozac and Viibryd. She reports elevated blood pressure from other antidepressants. She has not been taking any medications recently but already made arrangements for psychotherapy in [place], [place] where she lives.  Family psychiatric history. Multiple family members with depression and anxiety. Her aunt and her grandmother both attempted suicide.  Social history. She has been living in [place] for the past 10 years taking care of her father who had leukemia. She is in a supportive relationship. 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 Her mother and sister live in [place]. She was visiting with them at the time of overdose.  Principal Problem: Major depressive disorder, recurrent severe without psychotic features [facility]) Discharge Diagnoses: Patient Active Problem List   Diagnosis Date Noted  . Drug overdose, intentional, initial encounter (HCC) [T50.902A] 11/08/2016  . Suicidal ideation [R45.851] 11/08/2016  . Major depressive disorder, recurrent severe without psychotic features (HCC) [F33.2] 11/08/2016  . Hypokalemia [E87.6] 11/08/2016  . Acute respiratory failure with hypoxia (HCC) [J96.01] 11/08/2016  . Aspiration into respiratory tract [T17.908A] 11/08/2016  . QT prolongation [R94.31] 11/08/2016  . Acute metabolic encephalopathy [G93.41] 11/08/2016  . Pneumothorax on right: small apical [J93.9] 11/08/2016  . Overdose, intentional self-harm, initial encounter (HCC) [T50.902A]   . Overdose [T50.901A] 11/04/2016    Past Medical History:  Past Medical History:  Diagnosis Date  . Anxiety   . Arthritis   . Depressed   . Hypertension   . Substance abuse     Past Surgical History:  Procedure Laterality Date  . TONSILLECTOMY  Family History:  Family History  Problem Relation Age of Onset  . Anxiety disorder Mother   . Acute myelogenous  leukemia Father   . CAD Father    Social History:  History  Alcohol Use  . Yes    Comment: OCCASIONAL     History  Drug Use No    Social History   Social History  . Marital status: Single    Spouse name: N/A  . Number of children: N/A  . Years of education: N/A   Social History Main Topics  . Smoking status: Never Smoker  . Smokeless tobacco: Never Used  . Alcohol use Yes     Comment: OCCASIONAL  . Drug use: No  . Sexual activity: No   Other Topics Concern  . None   Social History Narrative  . None    Hospital Course:    Jennifer [name] is a 31 year old female with a history of depression transferred from [facility] ICU where she was hospitalized after suicide attempt by overdose on baclofen in the context of major loss.  1. Suicidal ideation. Resolved. The patient adamantly denies any thoughts, intention, or plans to hurt herself or others. She is able to contract for safety. She is forward thinking and more optimistic about the future. 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    2. Mood. She did not tolerate Cymbalta And was switched to Prozac that was helpful in the past.   3. Insomnia. We offered Trazodone.  4. Aspiration pneumonia. She is to continue Augmentin for 7 more days.  5. Swelling, numbness and pain of the legs. DVT was ruled out. She was started on Vibra for cellulitis.    6. Tachycardia. QTc prolongation caused by overdose has resolved. She was started on low dose metoprolol.   7. Disposition. She was discharged with family. She will follow upwith a psychiatrist in [place], [place] where she lives.   Physical Findings: AIMS: Facial and Oral Movements Muscles of Facial Expression: None, normal Lips and Perioral Area: None, normal Jaw: None, normal Tongue: None, normal,Extremity Movements Upper (arms, wrists, hands, fingers): None, normal Lower (legs, knees, ankles, toes): None, normal, Trunk Movements Neck, shoulders, hips: None, normal, Overall  Severity Severity of abnormal movements (highest score from questions above): None, normal Incapacitation due to abnormal movements: None, normal Patient's awareness of abnormal movements (rate only patient's report): No Awareness, Dental Status Current problems with teeth and/or dentures?: No Does patient usually wear dentures?: No  CIWA:  CIWA-Ar Total: 0 COWS:  COWS Total Score: 0  Musculoskeletal: Strength & Muscle Tone: within normal limits Gait & Station: normal Patient leans: N/A  Psychiatric Specialty Exam: Physical Exam  Nursing note and vitals reviewed. Psychiatric: She has a normal mood and affect. Her speech is normal and behavior is normal. Thought content normal. Cognition and memory are normal. She expresses impulsivity.    Review of Systems  Psychiatric/Behavioral: The patient is nervous/anxious.   All other systems reviewed and are negative.   Blood pressure 129/82, pulse (!) 108, temperature 98.7 F (37.1 C), temperature source Oral, resp. rate 18, height 5\' 11"  (1.803 m), weight 83.9 kg (185 lb), SpO2 100 %.Body mass index is 25.8 kg/m.  General Appearance: Casual  Eye Contact:  Good  Speech:  Clear and Coherent  Volume:  Normal  Mood:  Anxious  Affect:  Appropriate  Thought Process:  Goal Directed and Descriptions of Associations: Intact  Orientation:  Full (Time, Place, and Person)  Thought Content:  WDL  Suicidal Thoughts:  No  Homicidal Thoughts:  No  Memory:  Immediate;   Fair Recent;   Fair Remote;   Fair  Judgement:  Impaired  Insight:  Present  Psychomotor Activity:  Normal  Concentration:  Concentration: Fair and Attention Span: Fair  Recall:  F[employer]Fair  Fund of Knowledge:  Fair  Language:  Fair  Akathisia:  No  Handed:  Right  AIMS (if indicated):     Assets:  Communication Skills Desire for Improvement Housing Intimacy Physical Health Resilience Social Support Vocational/Educational  ADL's:  Intact  Cognition:  WNL  Sleep:  Number  of Hours: 5.3     Have you used any form of tobacco in the last 30 days? (Cigarettes, Smokeless Tobacco, Cigars, and/or Pipes): Patient Refused Screening  Has this patient used any form of tobacco in the last 30 days? (Cigarettes, Smokeless Tobacco, Cigars, and/or Pipes) Yes, No  Blood Alcohol level:  Lab Results  Component Value Date   ETH <5 11/04/2016    Metabolic Disorder Labs:  No results found for: HGBA1C, MPG No results found for: PROLACTIN Lab Results  Component Value Date   TRIG 53 11/04/2016    See Psychiatric Specialty Exam and Suicide Risk Assessment completed by Attending Physician prior to discharge.  Discharge destination:  Home  Is patient on multiple antipsychotic therapies at discharge:  No   Has Patient had three or more failed trials of antipsychotic monotherapy by history:  No  Recommended Plan for Multiple Antipsychotic Therapies: NA  Discharge Instructions    Diet - low sodium heart healthy    Complete by:  As directed    Increase activity slowly    Complete by:  As directed      Allergies as of 11/11/2016   No Known Allergies     Medication List    STOP taking these medications   DULoxetine 30 MG capsule Commonly known as:  CYMBALTA   ondansetron 4 MG/5ML solution Commonly known as:  ZOFRAN     TAKE these medications     Indication  amoxicillin-clavulanate 875-125 MG tablet Commonly known as:  AUGMENTIN Take 1 tablet by mouth every 12 (twelve) hours.  Indication:  Pneumonia   doxycycline 100 MG tablet Commonly known as:  VIBRA-TABS Take 1 tablet (100 mg total) by mouth every 12 (twelve) hours.  Indication:  Infection caused by Bacteria   FLUoxetine 20 MG capsule Commonly known as:  PROZAC Take 3 capsules (60 mg total) by mouth daily.  Indication:  Major Depressive Disorder, Obsessive Compulsive Disorder   metoprolol tartrate 25 MG tablet Commonly known as:  LOPRESSOR Take 1 tablet (25 mg total) by mouth 2 (two) times  daily.  Indication:  High Blood Pressure Disorder      Follow-up Information    [facility]. Go on 11/21/2016.   Why:  Please attend your follow up appointment on Monday, 11/21/16, at 8:30am.  Please bring a copy of your hospital discharge paperwork. Contact information: [address] [place], [place] [id number] P: [phone] F: [phone]          Follow-up recommendations:  Activity:  As tolerated. Diet:  Low sodium heart healthy. Other:  Keep follow-up appointments.  Comments:    Signed: Kristine LineaJolanta Shelsey Rieth, MD 11/11/2016, 4:22 PM

## 2016-11-11 NOTE — Progress Notes (Signed)
Patient denies SI/HI, denies A/V hallucinations. Patient verbalizes understanding of discharge instructions, follow up care and prescriptions. Patient given all belongings from  locker. Patient escorted out by staff, transported by family. 

## 2016-11-11 NOTE — Progress Notes (Signed)
  [facility] Discharge Plan :  Will you be returning to the same living situation after discharge:  Yes,  with f[place]e in [place] At discharge, do you have transportation home?: Yes,  initially with mother in [place] Do you have the ability to pay for your medications: No. Pt lives in [place]  Release of information consent forms completed and in the chart;  Patient's signature needed at discharge.  Patient to Follow up at: Follow-up Information    [facility]. Go on 11/21/2016.   Why:  Please attend your follow up appointment on Monday, 11/21/16, at 8:30am.  Please bring a copy of your hospital discharge paperwork. Contact information: [address] [place], [place] [id number] P: [phone] F: [phone]          Next level of care provider has [facility] to [facility] Link:no  Safety Planning and Suicide Prevention discussed: Yes,  with fiancee  Have you used any form of tobacco in the last 30 days? (Cigarettes, Smokeless Tobacco, Cigars, and/or Pipes): Patient Refused Screening  Has patient been referred to the Quitline?: Patient refused referral  Patient has been referred for addiction treatment: Yes  [name], Jennifer Creech [name], LCSW 11/11/2016, 4:03 PM

## 2016-11-14 NOTE — Progress Notes (Signed)
Recreation Therapy Notes  INPATIENT RECREATION TR PLAN  Patient Details Name: Jennifer [name] MRN: [id number] DOB: 06-Aug-1985 Today's Date: 11/14/2016  Rec Therapy Plan Is patient appropriate for Therapeutic Recreation?: Yes Treatment times per week: At least once a week TR Treatment/Interventions: 1:1 session, Group participation (Comment) (Appropriate participation in daily recreational therapy tx)  Discharge Criteria Pt will be discharged from therapy if:: Treatment goals are met, Discharged Treatment plan/goals/alternatives discussed and agreed upon by:: Patient/family  Discharge Summary Short term goals set: See Care Plan Short term goals met: Adequate for discharge Progress toward goals comments: One-to-one attended Which groups?: Leisure education One-to-one attended: Self-esteem, stress management Reason goals not met: Pt d/c before goal could be met Therapeutic equipment acquired: None Reason patient discharged from therapy: Discharge from hospital Pt/family agrees with progress & goals achieved: Yes Date patient discharged from therapy: 11/11/16   Leonette Monarch, LRT/CTRS  11/14/2016, 2:55 PM

## 2019-02-06 IMAGING — CT CT HEAD W/O CM
4 series · 15 of 47 positions shown, 17 images · non-contrast
Comparison: None.

CLINICAL DATA: Found on floor, unresponsive.

EXAM:
CT HEAD WITHOUT CONTRAST
TECHNIQUE: Contiguous axial images were obtained from the base of the skull
through the vertex without intravenous contrast.

[Series 3: head without · axial · non-contrast · 0.50mm/px · z∈[-69,+51]mm · 7 of 33 slices shown, 9 images]
[im 5/33  brain]
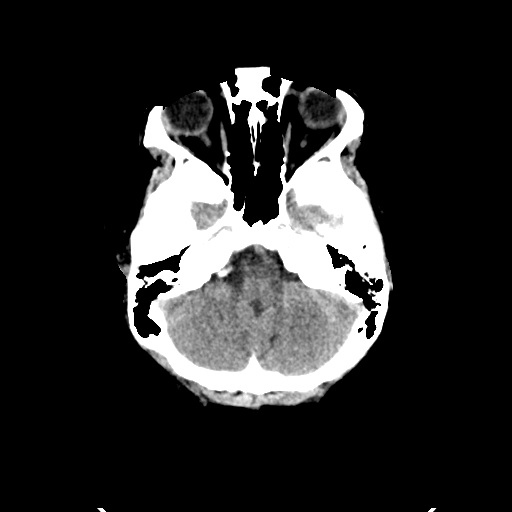
[im 5/33  bone]
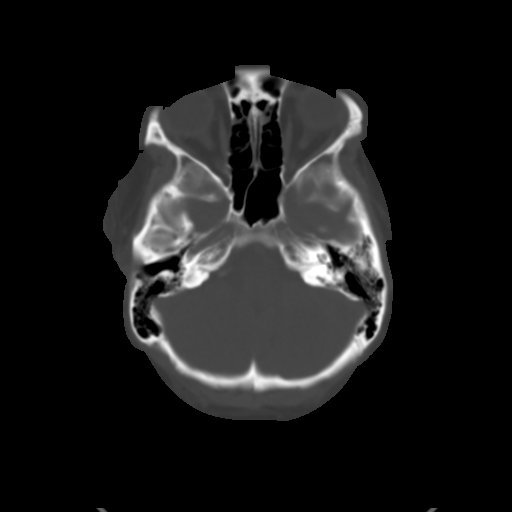
[im 9/33  brain]
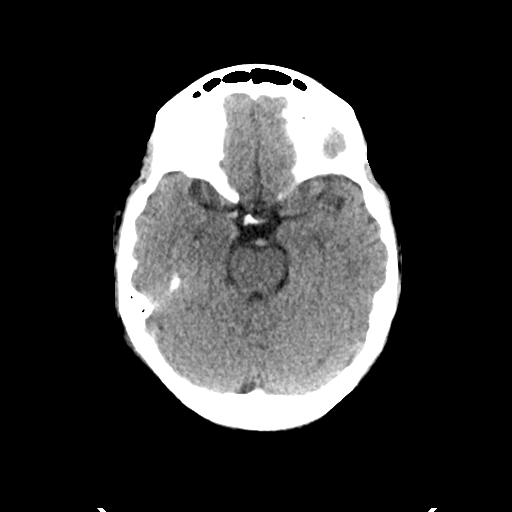
[im 13/33  brain]
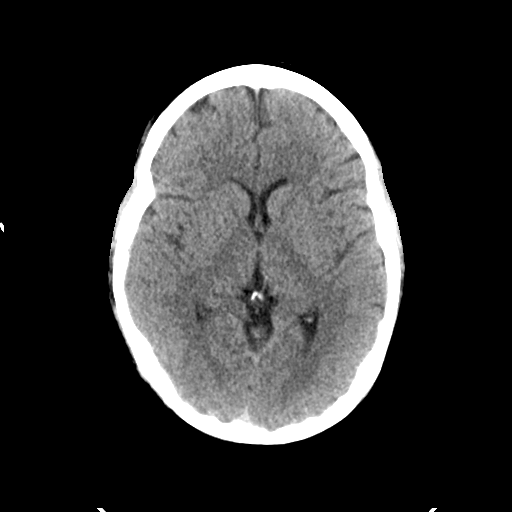
[im 17/33  brain]
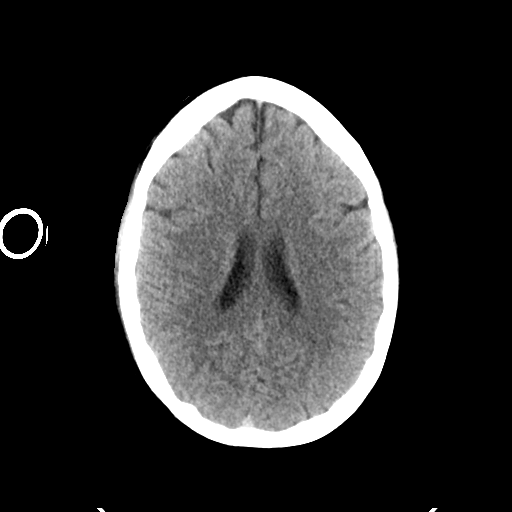
[im 21/33  brain]
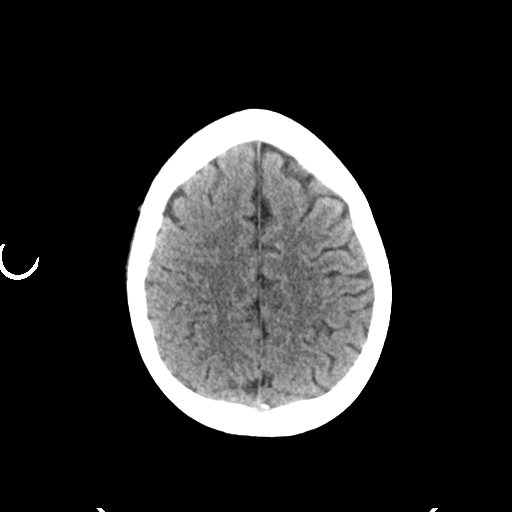
[im 21/33  bone]
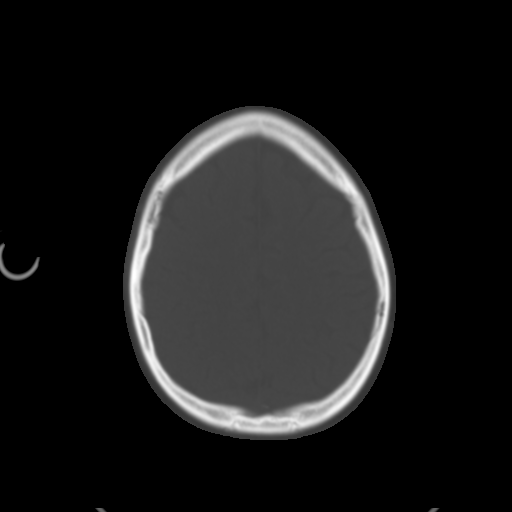
[im 25/33  brain]
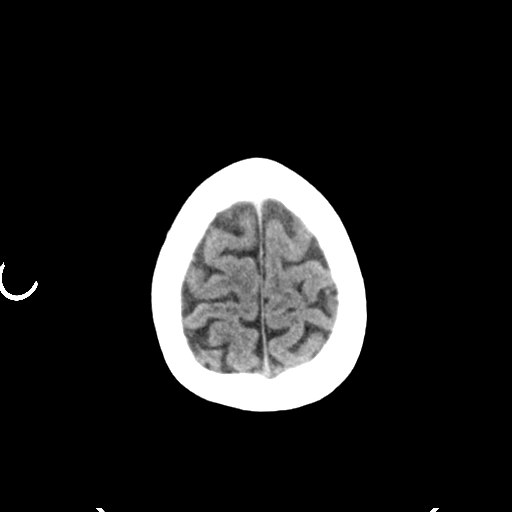
[im 29/33  brain]
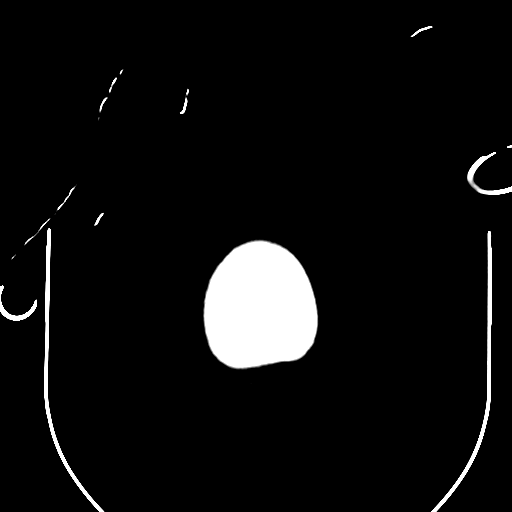

[Series 4: head bone · axial · 0.50mm/px · z∈[-73,-57]mm · 2 of 82 slices shown]
[im 9/82  bone]
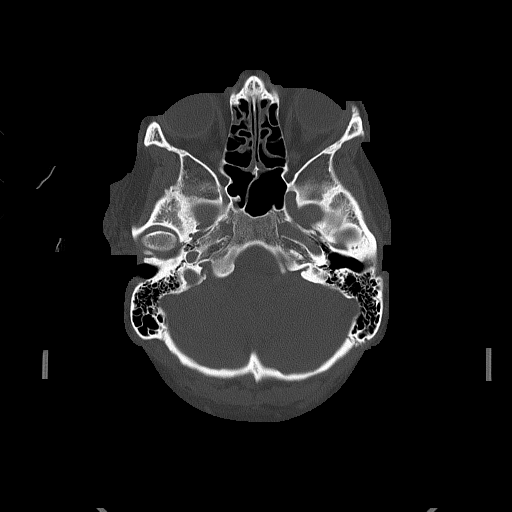
[im 17/82  bone]
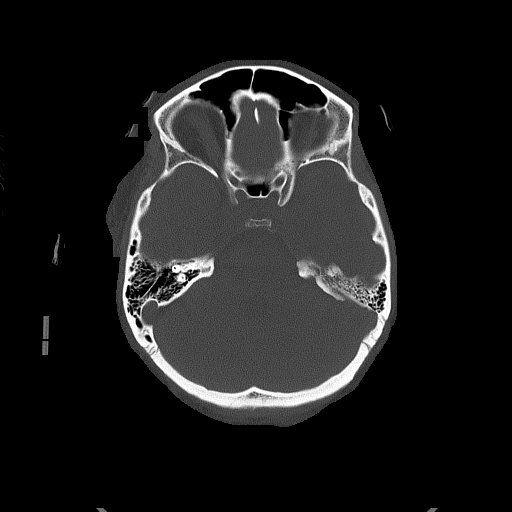

[Series 5: head without cor · coronal · non-contrast · 0.35mm/px · 3 of 65 slices shown]
[im 22/65  brain]
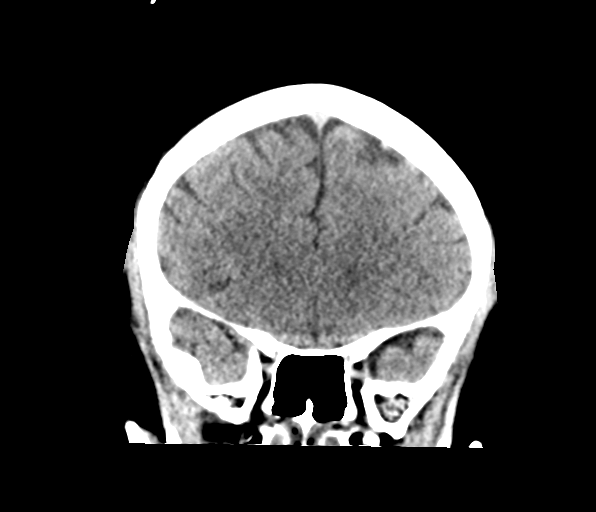
[im 29/65  brain]
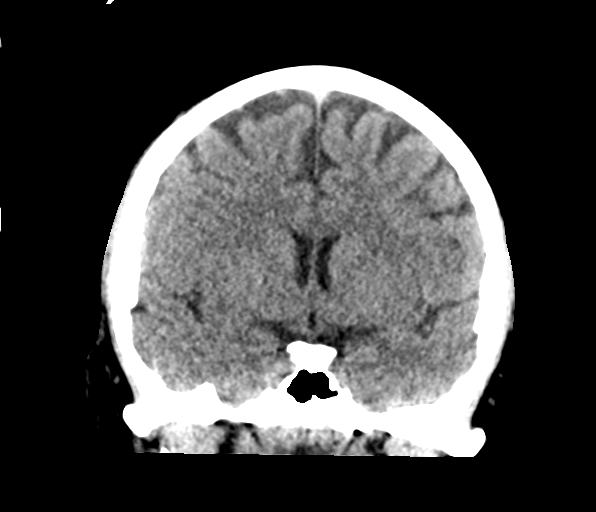
[im 36/65  brain]
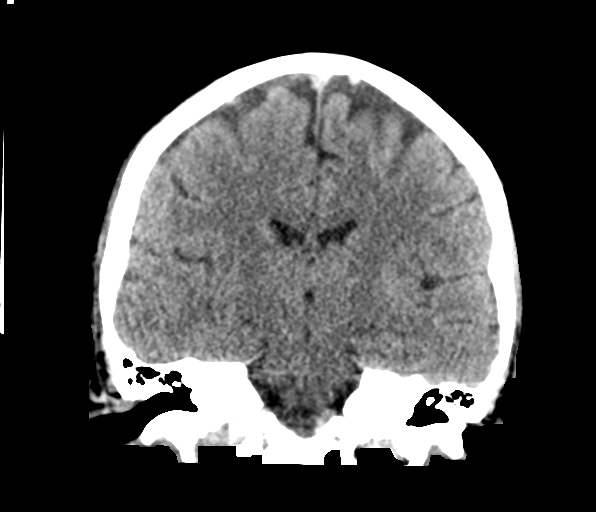

[Series 6: head without sag · sagittal · non-contrast · 0.32mm/px · 3 of 61 slices shown]
[im 21/61  brain]
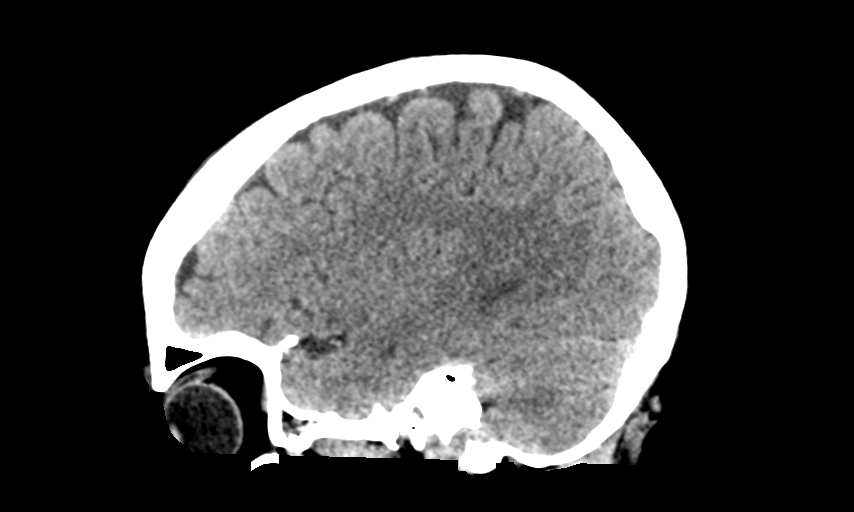
[im 31/61  brain]
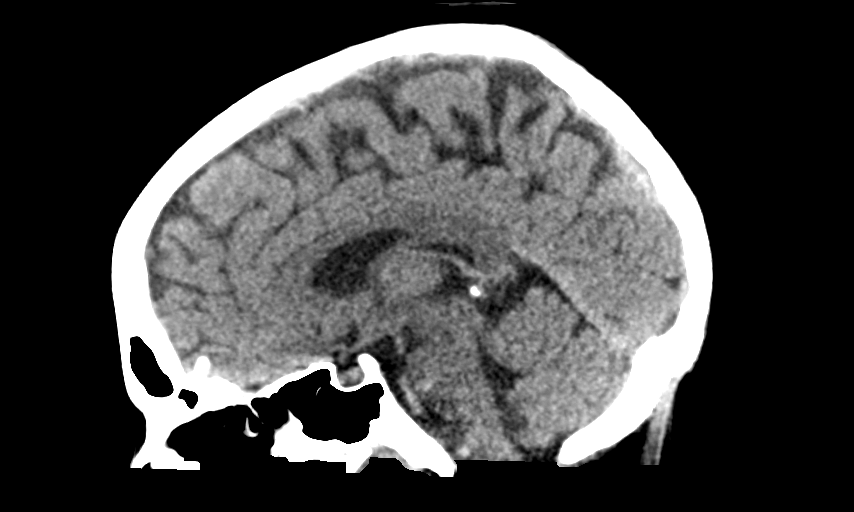
[im 41/61  brain]
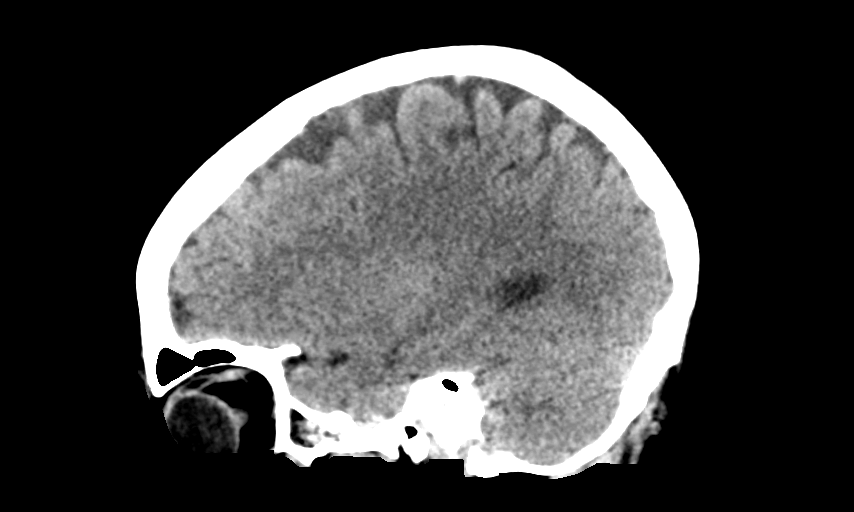

[15 of 47 positions shown; findings below may reference images not displayed]

FINDINGS: Brain: No acute intracranial abnormality. Specifically, no
hemorrhage, hydrocephalus, mass lesion, acute infarction, or
significant intracranial injury.

Vascular: No hyperdense vessel or unexpected calcification.

Skull: No acute calvarial abnormality.

Sinuses/Orbits: Mucosal thickening in the paranasal sinuses. Mastoid
air cells are clear. Orbital soft tissues unremarkable.

Other: None
IMPRESSION: No acute intracranial abnormality.

Chronic sinusitis.
